# Patient Record
Sex: Male | Born: 1975 | Race: Black or African American | Hispanic: No | Marital: Single | Smoking: Never smoker
Health system: Southern US, Community
[De-identification: ages and names within clinical notes are randomized; demographics above are authoritative.]

## PROBLEM LIST (undated history)

## (undated) HISTORY — PX: LEG SURGERY: SHX1003

---

## 1999-02-15 ENCOUNTER — Emergency Department (HOSPITAL_COMMUNITY): Admission: EM | Admit: 1999-02-15 | Discharge: 1999-02-15 | Payer: Self-pay | Admitting: Emergency Medicine

## 2001-03-30 ENCOUNTER — Emergency Department (HOSPITAL_COMMUNITY): Admission: EM | Admit: 2001-03-30 | Discharge: 2001-03-31 | Payer: Self-pay | Admitting: Emergency Medicine

## 2001-03-31 ENCOUNTER — Emergency Department (HOSPITAL_COMMUNITY): Admission: EM | Admit: 2001-03-31 | Discharge: 2001-04-01 | Payer: Self-pay | Admitting: Emergency Medicine

## 2001-10-13 ENCOUNTER — Emergency Department (HOSPITAL_COMMUNITY): Admission: EM | Admit: 2001-10-13 | Discharge: 2001-10-13 | Payer: Self-pay

## 2001-12-30 ENCOUNTER — Emergency Department (HOSPITAL_COMMUNITY): Admission: EM | Admit: 2001-12-30 | Discharge: 2001-12-31 | Payer: Self-pay | Admitting: Emergency Medicine

## 2002-02-01 ENCOUNTER — Emergency Department (HOSPITAL_COMMUNITY): Admission: EM | Admit: 2002-02-01 | Discharge: 2002-02-01 | Payer: Self-pay | Admitting: Emergency Medicine

## 2002-04-10 ENCOUNTER — Emergency Department (HOSPITAL_COMMUNITY): Admission: EM | Admit: 2002-04-10 | Discharge: 2002-04-10 | Payer: Self-pay | Admitting: Emergency Medicine

## 2002-07-18 ENCOUNTER — Emergency Department (HOSPITAL_COMMUNITY): Admission: EM | Admit: 2002-07-18 | Discharge: 2002-07-18 | Payer: Self-pay | Admitting: Emergency Medicine

## 2002-08-03 ENCOUNTER — Emergency Department (HOSPITAL_COMMUNITY): Admission: EM | Admit: 2002-08-03 | Discharge: 2002-08-03 | Payer: Self-pay | Admitting: Emergency Medicine

## 2002-08-13 ENCOUNTER — Emergency Department (HOSPITAL_COMMUNITY): Admission: EM | Admit: 2002-08-13 | Discharge: 2002-08-13 | Payer: Self-pay | Admitting: Emergency Medicine

## 2003-07-06 ENCOUNTER — Emergency Department (HOSPITAL_COMMUNITY): Admission: EM | Admit: 2003-07-06 | Discharge: 2003-07-06 | Payer: Self-pay | Admitting: Emergency Medicine

## 2003-07-12 ENCOUNTER — Emergency Department (HOSPITAL_COMMUNITY): Admission: EM | Admit: 2003-07-12 | Discharge: 2003-07-12 | Payer: Self-pay | Admitting: Emergency Medicine

## 2003-07-26 ENCOUNTER — Emergency Department (HOSPITAL_COMMUNITY): Admission: EM | Admit: 2003-07-26 | Discharge: 2003-07-26 | Payer: Self-pay | Admitting: Emergency Medicine

## 2003-07-28 ENCOUNTER — Emergency Department (HOSPITAL_COMMUNITY): Admission: EM | Admit: 2003-07-28 | Discharge: 2003-07-28 | Payer: Self-pay | Admitting: Emergency Medicine

## 2003-07-29 ENCOUNTER — Emergency Department (HOSPITAL_COMMUNITY): Admission: EM | Admit: 2003-07-29 | Discharge: 2003-07-29 | Payer: Self-pay | Admitting: Emergency Medicine

## 2004-04-02 ENCOUNTER — Emergency Department (HOSPITAL_COMMUNITY): Admission: EM | Admit: 2004-04-02 | Discharge: 2004-04-02 | Payer: Self-pay | Admitting: Emergency Medicine

## 2008-12-29 ENCOUNTER — Emergency Department (HOSPITAL_COMMUNITY): Admission: EM | Admit: 2008-12-29 | Discharge: 2008-12-30 | Payer: Self-pay | Admitting: Emergency Medicine

## 2010-06-13 ENCOUNTER — Inpatient Hospital Stay (HOSPITAL_COMMUNITY)
Admission: AC | Admit: 2010-06-13 | Discharge: 2010-07-01 | Payer: Self-pay | Source: Home / Self Care | Attending: Orthopedic Surgery | Admitting: Orthopedic Surgery

## 2010-07-14 ENCOUNTER — Inpatient Hospital Stay (HOSPITAL_COMMUNITY)
Admission: AD | Admit: 2010-07-14 | Discharge: 2010-07-27 | Payer: Self-pay | Source: Home / Self Care | Attending: Plastic Surgery | Admitting: Plastic Surgery

## 2010-07-22 LAB — COMPREHENSIVE METABOLIC PANEL
ALT: 11 U/L (ref 0–53)
AST: 20 U/L (ref 0–37)
Albumin: 2.9 g/dL — ABNORMAL LOW (ref 3.5–5.2)
Alkaline Phosphatase: 63 U/L (ref 39–117)
BUN: 9 mg/dL (ref 6–23)
CO2: 28 mEq/L (ref 19–32)
Calcium: 9.4 mg/dL (ref 8.4–10.5)
Chloride: 102 mEq/L (ref 96–112)
Creatinine, Ser: 1.44 mg/dL (ref 0.4–1.5)
GFR calc Af Amer: >60 mL/min (ref >60)
GFR calc non Af Amer: 56 mL/min — ABNORMAL LOW (ref >60)
Glucose, Bld: 89 mg/dL (ref 70–99)
Potassium: 4.3 mEq/L (ref 3.5–5.1)
Sodium: 141 mEq/L (ref 135–145)
Total Bilirubin: 0.3 mg/dL (ref 0.3–1.2)
Total Protein: 6.7 g/dL (ref 6.0–8.3)

## 2010-07-22 LAB — CBC
HCT: 28.3 % — ABNORMAL LOW (ref 39.0–52.0)
Hemoglobin: 8.9 g/dL — ABNORMAL LOW (ref 13.0–17.0)
MCH: 27.3 pg (ref 26.0–34.0)
MCHC: 31.4 g/dL (ref 30.0–36.0)
MCV: 86.8 fL (ref 78.0–100.0)
Platelets: 564 10*3/uL — ABNORMAL HIGH (ref 150–400)
RBC: 3.26 MIL/uL — ABNORMAL LOW (ref 4.22–5.81)
RDW: 15.6 % — ABNORMAL HIGH (ref 11.5–15.5)
WBC: 6.5 10*3/uL (ref 4.0–10.5)

## 2010-08-13 NOTE — Op Note (Signed)
  [name], [name]              ACCOUNT NO.:  [id number]  MEDICAL RECORD NO.:  [id number]          PATIENT TYPE:  INP  LOCATION:  5012                         FACILITY:  [facility]  PHYSICIAN:  Loreta Ave, MD DATE OF BIRTH:  10-24-1975  DATE OF PROCEDURE:  07/15/2010 DATE OF DISCHARGE:                              OPERATIVE REPORT   PREOPERATIVE DIAGNOSIS:  Left open tibia fracture, Gustilo IIIB.  POSTOPERATIVE DIAGNOSIS:  Left open tibia fracture, Gustilo IIIB.  PROCEDURE:  Debridement of failed free flap, left leg.  SURGEON:  Loreta Ave, MD  ANESTHESIA:  General.  ESTIMATED BLOOD LOSS:  100 mL.  SPECIMENS:  Cultures sent to microbiology.  FINDINGS:  Necrotic flap without purulence.  COMPLICATIONS:  None.  CLINICAL INDICATION:  Don Beard is a 35 year old male who is status post free flap coverage of the left open Gustilo IIIB tibia fracture. He initially did well and his free latissimus flap was healing uneventfully.  He was discharged from the hospital approximately 2 weeks ago without apparent complication.  He had been seen regularly in the outpatient setting with a viable free flap.  However, he came into my office yesterday for routine check with clearly nonviable flap.  He was admitted to the hospital last night and begun on empiric antibiotics.  He understood that today was going to be a debridement procedure and placement of a VAC.  He understood the risks of surgery to include but not be limited to bleeding, infection, damage to nearby structures as well as the need for more surgery.  He desired to proceed.  DESCRIPTION OF OPERATION:  The patient was brought to the operating room, placed in the supine position on the operating room table.  After a smooth and routine induction of general anesthesia, left lower extremity was prepped with chlorhexidine and draped into a sterile field.  His flap was examined and was entirely nonviable.  It  was removed from the left leg with Mayo scissors.  There was obvious thrombosis throughout the flap.  The deep surface of the flap was rather adherent to the underlying tissue suggesting an interval period of healing before flap loss.  The flap was removed in its entirety and the pedicle down to the anterior tibial artery was dissected.  It was clamped and ligated.  Both the artery and the vein were thrombosed. Next, a curette was used to remove all nonviable debris from the wound and the wound was irrigated with 6 liters of normal saline via pulse lavage and then 500 mL of bacitracin-containing normal saline via pulse lavage.  A VAC dressing was then applied and set to 125 mm of continuous suction with good seal.  Sponge and needle counts reported as correct x2.  The patient was then extubated and transferred to the recovery room in stable condition.     Loreta Ave, MD     CF/MEDQ  D:  07/15/2010  T:  07/15/2010  Job:  [id number]  Electronically Signed by Loreta Ave MD on 08/13/2010 03:18:03 PM
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
## 2010-08-13 NOTE — Discharge Summary (Signed)
Don Beard, Don Beard              ACCOUNT NO.:  [id number]  MEDICAL RECORD NO.:  [id number]          PATIENT TYPE:  INP  LOCATION:  5012                         FACILITY:  [facility]  PHYSICIAN:  Loreta Ave, MD DATE OF BIRTH:  [birth year]  DATE OF ADMISSION:  07/14/2010 DATE OF DISCHARGE:  07/27/2010                              DISCHARGE SUMMARY   DISCHARGE DIAGNOSES: 1. Left lower extremity Gustilo IIIB open tibia-fibula fracture. 2. Acute stress disorder.  PROCEDURES: 1. July 15, 2010, debridement of left lower extremity free flap. 2. July 18, 2010, debridement of left lower extremity and VAC     placement. 3. July 22, 2010, left lower extremity split-thickness skin graft     and fasciocutaneous flap with VAC application.  MICROBIOLOGY DATA:  Wound culture, July 15, 2010, of the left lower extremity revealed multiple organisms, no Strep A, and no Staph.  IV LINES:  PICC inserted, July 25, 2010.  LABORATORY DATA:  July 22, 2010, white blood count was 6.5, hemoglobin was 8.9, hematocrit was 28.3, platelets were 564, and albumin was 2.9.  CONSULTATIONS: 1. Infectious Disease consult was obtained on July 25, 2010 for     antibiotic recommendations due to left lower extremity Gustilo IIIB     open tip-fib fracture. 2. Psych consult was obtained on July 25, 2010 for acute stress     disorder related to his traumatic injury to his left lower     extremity.  HISTORY:  Don Beard is a 35 year old African American male admitted to the hospital on July 14, 2010 for a necrotic left leg.  He has a history of traumatic injury to left lower extremity, the result of this is left leg being caught in a conveyor belt while working.  This resulted in a Gustilo IIIB fracture with an open tib-fib that required a free latissimus flap for coverage of the bone.  During his followup appointment, he presented with a necrotic muscle to the left lower extremity on  July 14, 2010.  He was therefore admitted for a debridement of his left lower extremity.  The initial debridement was performed on July 15, 2010 with removal of necrotic free flap.  The second surgical debridement was completed on July 18, 2010 with a placement of VAC on the left lower extremity.  The final procedure was completed on July 22, 2010 with a fasciocutaneous flap to left lower extremity with split-thickness skin graft.  While in the hospital, he remained on Lovenox for DVT prevention.  He was afebrile and his vital signs remained stable.  Don Beard pain was adequately managed while hospitalized.  At the time of discharge, his left lower extremity split- thickness skin graft was 100% take and his flap on the left lower extremity and the place with the edge was approximated.  No exudate, no odor, and no surrounding erythema was noted.  HOSPITAL COURSE BY DISCHARGE DIAGNOSIS: 1. Left lower extremity Gustilo IIIB open tib-fib fracture. 2. Acute stress disorder.  DISCHARGE MEDICATIONS: 1. Docusate 100 mg by mouth twice daily. 2. Invanz IV 1 gram daily. 3. Dilaudid 4 mg by mouth every 2 hours as needed  for pain. 4. Robaxin 1000 mg by mouth 4 times a day. 5. OxyContin 40 mg by mouth twice daily for pain. 6. Oxycodone 20-30 mg by mouth every 4 hours as needed for pain. 7. Lyrica 75 mg by mouth 3 times a day. 8. Rifampin 600 mg by mouth every 24 hours. 9. Senna 1 tablet by mouth twice daily before meals as needed for     constipation. 10.Docusate 100 mg 4-5 tablets by mouth daily.  DISCHARGE INSTRUCTIONS:  He is instructed not to drive while on narcotics.  DIET:  No dietary restrictions.  WOUND CARE:  Left lower extremity, Xeroform, Kerlix, and Ace wrap daily. Right thigh, change Mepilex border every 7 days until healed.  FOLLOWUP APPOINTMENTS: 1. Dr. Noelle Penner to be called at [phone] for a followup appointment on     August 04, 2010. 2. [facility] for a followup appointment to his home. 3. Dr. Carola Frost is to call for a followup up appointment at [phone]. 4. Home Health[facility] will follow him upon discharge for wound     care and IV antibiotics and PICC line maintenance. 5. He is also instructed to call [facility] to make     a followup outpatient appointment.  Don Beard is instructed to     call Dr. Noelle Penner if he experiences any nausea, vomiting, fever,     chills, or pain not relieved with medication.  DISPOSITION:  At the time of discharge, Don Beard has met the maximum benefit from inpatient therapy as medically stable for discharge to home.  TIME SPENT ON DISPOSITION:  30 minutes.     Tonye Becket, NP   ______________________________ Loreta Ave, MD    AC/MEDQ  D:  07/27/2010  T:  07/27/2010  Job:  [id number]  Electronically Signed by Loreta Ave MD on 08/13/2010 03:18:01 PM

## 2010-08-13 NOTE — Discharge Summary (Signed)
Don Beard, Don Beard              ACCOUNT NO.:  [id number]  MEDICAL RECORD NO.:  [id number]          PATIENT TYPE:  INP  LOCATION:  5004                         FACILITY:  [facility]  PHYSICIAN:  Loreta Ave, MD DATE OF BIRTH:  02/21/1976  DATE OF ADMISSION:  06/13/2010 DATE OF DISCHARGE:  07/01/2010                              DISCHARGE SUMMARY   DISCHARGE DIAGNOSES: 1. Grade IIIB open left tibia-fibula fracture status post industrial     accident. 2. Free flap to open wound of left tibia. 3. Acute blood loss anemia, stable. 4. Hyponatremia, resolved. 5. Left medial malleolus fracture.  HOSPITAL COURSE:  On June 22, 2010, Mr. Don Beard was transferred to Dr. Kallie Edward service as he was deemed appropriate for a left free latissimus flap to his left lower extremity wound.  On June 22, 2010, Dr. Noelle Penner performed a left free latissimus flap to his left leg, split- thickness skin graft to his left free flap and debridement of his left lower extremity wound and also application of the VAC.  At the completion of his operative procedure, he remained stable and was transferred to the floor.  He tolerated a regular diet.  He remained afebrile throughout his hospitalization.  His VAC was removed on June 25, 2010, at which time the skin graft was 100% take as well as a positive arterial signal over the muscle flap.  After surgery, an arterial signal was noted throughout his hospitalization of his muscle flap and he also continued to have a Doppler left pedal pulse.  His pain was controlled throughout his hospitalization.  He was voiding spontaneously and his JPs remained in place to his left lateral back incision.  DISCHARGE INSTRUCTIONS:  He is to remain on a regular diet.  ACTIVITIES:  He is to increase his activity slowly and ambulate with crutches and remain nonweightbearing on the left and he is also instructed to elevate his left lower extremity when sitting or in  his bed.  He is not to drive.  WOUND CARE:  Left lower extremity, Xeroform with Kerlix to be changed daily.  His left thigh donor site, he is instructed to change his Mepilex border in 5 days.  His left lateral back is to remain open to air and allow the Steri-Strips to come off.  He is instructed to empty his JP drains daily and record their outputs.  DISCHARGE MEDICATIONS: 1. Aspirin 81 mg by mouth daily. 2. Colace 100 mg by mouth twice daily as needed. 3. Lovenox subcutaneously daily at bedtime. 4. Robaxin 100 mg by mouth every 6 hours as needed for muscle spasms. 5. Oxycodone 30 mg suspended release every 12 hours as needed. 6. Oxycodone 10-30 mg by mouth every 4 hours as needed for pain. 7. Lyrica 75 mg by mouth 3 times a day.  DISCHARGE INSTRUCTIONS:  He is instructed to call Dr. Noelle Penner if he experiences any nausea, vomiting, fever, chills, or pain, not relieved pain, and he is also instructed to call if he has any questions or concerns about his left lower extremity muscle flap.  He is to call and schedule an appointment with Dr. Noelle Penner for  July 05, 2010 at [phone].  He is to follow with Dr. Carola Frost on July 15, 2010.  He will also receive home health which will be provided by [facility]. At the time of discharge, Don Beard has met maximum benefit from inpatient therapy and he is medically stable for discharge to home.     Tonye Becket, NP   ______________________________ Loreta Ave, MD    AC/MEDQ  D:  07/01/2010  T:  07/02/2010  Job:  [id number]  Electronically Signed by Loreta Ave MD on 08/13/2010 03:14:01 PM

## 2010-08-13 NOTE — Op Note (Signed)
Don Beard, Don Beard              ACCOUNT NO.:  [id number]  MEDICAL RECORD NO.:  [id number]          PATIENT TYPE:  INP  LOCATION:  5012                         FACILITY:  [facility]  PHYSICIAN:  Loreta Ave, MD DATE OF BIRTH:  05-Jun-1976  DATE OF PROCEDURE:  07/22/2010 DATE OF DISCHARGE:                              OPERATIVE REPORT   PREOPERATIVE DIAGNOSIS:  Left leg wound.  POSTOPERATIVE DIAGNOSIS:  Left leg wound.  PROCEDURE PERFORMED: 1. Preparation of recipient site for flap and skin graft, left leg. 2. Fasciocutaneous perforator flap, left leg. 3. Split-thickness skin graft, 205 cm2. 4. Application of VAC.  SURGEON:  Loreta Ave, MD  ASSISTANT:  Tonye Becket, NP  ANESTHESIA:  General.  ESTIMATED BLOOD LOSS:  100 mL.  IV FLUIDS:  Per Anesthesia.  URINE OUTPUT:  Not recorded.  COMPLICATIONS:  None.  CLINICAL INDICATION:  Don Beard is a 35 year old male who is status post Gustilo IIIB open tib-fib fracture of the left leg.  He has undergone previous free flap coverage which ultimately failed.  He has been washed out multiple times and now presents for definitive coverage of his left leg.  Surgical options have been discussed with him including prolonged VAC with Integra and eventual skin graft versus fasciocutaneous perforator flap versus repeat attempts at free flap.  He and I have decided to proceed with fasciocutaneous perforator flap coverage as well as skin grafting.  He understands the risks of surgery to include but not be limited to bleeding, infection, damage to nearby structures, nonunion, malunion of his fracture as well as loss of some or all of his flap or skin grafts.  He also understands he may need more surgery down the road.  DESCRIPTION OF OPERATION:  The patient was brought to the operating room and placed in supine position on the operating room table.  After smooth and routine induction of general anesthesia with endotracheal  tube, bilateral lower extremities were prepped with Betadine scrub and paint and draped into sterile fields.  The right lower extremity was to be used for a skin graft harvest site.  Attention was first turned to the identification of fasciocutaneous perforator of the peroneal artery along the lateral intermuscular septum.  One was located approximately 2 cm inferior to his wound.  Given the patient's wound architecture of having exposed fracture site and tibia without periosteum and the distal third of the leg and the inferior aspect of the soft tissue defect, a rotational flap on the lateral and inferior portion of the defect would best cover this.  The perforating artery and vein were identified via Doppler and marked.  The flap was designed to be 6.5 cm wide and 9 cm long.  It was designed to be a rotational-type flap.  Next, his fracture site as well as the exposed wound were debrided with curettes and copiously irrigated.  Hemostasis was verified with bipolar electrocautery.  Next, the flap was incised from its medial aspect moving inferiorly towards the base of the pedicle.  This was done with a 10 blade and large perforating veins were controlled with 4-0 Vicryl ties and bipolar cautery was used  to obtain hemostasis.  I dissected in the superficial fascial plane of the leg.  This was relatively avascular.  Once the flap was raised in its entirety, it was elevated and the pedicle was again listened to with a Doppler and there was audible signal in both the artery and vein at the base of the flap.  A back cut was then performed for 2.5 cm to allow rotation to cover the fracture site and rotated it well.  The distal 1.5 cm were trimmed to fit the skin edge on the medial side of the defect and this was inset without tension.  Next, the wound was irrigated with normal saline. Hemostasis was verified with bipolar electrocautery.  Next, a Zimmer Dermatome was used to harvest skin grafts  from the right thigh.  Two- inch guard was used and three 10-cm sections were harvested at 04/999 of an inch thick and meshed in a 1.5:1 fashion.  Next, the skin grafts were spread over the flap donor site as well as the soft tissue defect of the leg.  The flap donor site was 6 x 8 cm.  The soft tissue defect was 16 x 10 cm.  The skin graft was then inset dermis side down and trimmed to fit the defects.  Skin grafts were then sewn with running 4-0 chromic sutures.  The flap was inset along the medial margin of the wound with interrupted 2-0 nylon sutures.  Mepilex Border was placed on the right thigh donor site  Map with holes was placed over the skin graft as was a VAC dressing which was set to 125 mm of continuous suction with good seal.  Kerlix wrap as well as an Ace wrap and a heel ulcer prevention boot were placed on the left leg.  The patient tolerated the procedure well, was extubated, and transferred to recovery room in stable condition.  Sponge and needle counts were reported as correct x2.     Loreta Ave, MD     CF/MEDQ  D:  07/22/2010  T:  07/23/2010  Job:  [id number]  Electronically Signed by Loreta Ave MD on 08/13/2010 03:18:06 PM

## 2010-08-13 NOTE — Op Note (Signed)
  [name], [name]              ACCOUNT NO.:  [id number]  MEDICAL RECORD NO.:  [id number]          PATIENT TYPE:  INP  LOCATION:  5012                         FACILITY:  [facility]  PHYSICIAN:  Loreta Ave, MD DATE OF BIRTH:  [birth year]  DATE OF PROCEDURE:  07/18/2010 DATE OF DISCHARGE:                              OPERATIVE REPORT   PREOPERATIVE DIAGNOSIS:  Left leg wound.  POSTOPERATIVE DIAGNOSIS:  Left leg wound.  SURGEON:  Loreta Ave, MD  PROCEDURE PERFORMED:  Irrigation and debridement of left leg wound, application of MAC.  ESTIMATED BLOOD LOSS:  Minimal.  IV FLUIDS:  Per Anesthesia.  URINE OUTPUT:  Not recorded.  COMPLICATIONS:  None.  CLINICAL INDICATION:  Don Beard is a 35 year old male who sustained Gustilo IIIB open tib-fib fracture approximately 3 weeks ago.  He had a free flap placed which failed after 2 weeks.  He was returned to the operating room last week for debridement and presents again for serial debridements and washout of his fracture site.  He understands the risks of surgery to include but not be limited to bleeding, infection, damage to nearby structures, hardware infection, nonunion, malunion, the need for more surgery.  He desires to proceed.  DESCRIPTION OF OPERATION:  The patient was brought to the operating room, placed in the supine position on the operating room table.  He was continued on his Zosyn antibiotic therapy.  His back was removed, and his left lower extremity was prepped with chlorhexidine and draped into a sterile field.  The wound was debrided gently with curettes and the fracture site was as well.  It was then irrigated with 2 L of normal saline via pulse lavage and then 500 mL of bacitracin containing normal saline via pulse lavage.  Bovie electrocautery was used to obtain hemostasis and a VAC dressing was applied and set to 125 mm of suction with good seal.  Sponge and needle counts were reported as  correct x2. The patient tolerated the procedure well, was extubated and transferred to the recovery room in stable condition.     Loreta Ave, MD     CF/MEDQ  D:  07/22/2010  T:  07/23/2010  Job:  [id number]  Electronically Signed by Loreta Ave MD on 08/13/2010 03:18:12 PM

## 2010-08-29 ENCOUNTER — Ambulatory Visit (INDEPENDENT_AMBULATORY_CARE_PROVIDER_SITE_OTHER): Payer: Worker's Compensation | Admitting: Internal Medicine

## 2010-08-29 ENCOUNTER — Encounter (INDEPENDENT_AMBULATORY_CARE_PROVIDER_SITE_OTHER): Payer: Self-pay | Admitting: *Deleted

## 2010-08-29 ENCOUNTER — Encounter: Payer: Self-pay | Admitting: Internal Medicine

## 2010-08-29 DIAGNOSIS — M86169 Other acute osteomyelitis, unspecified tibia and fibula: Secondary | ICD-10-CM

## 2010-08-29 DIAGNOSIS — F438 Other reactions to severe stress: Secondary | ICD-10-CM | POA: Insufficient documentation

## 2010-08-29 DIAGNOSIS — S82409B Unspecified fracture of shaft of unspecified fibula, initial encounter for open fracture type I or II: Secondary | ICD-10-CM

## 2010-08-29 DIAGNOSIS — F4389 Other reactions to severe stress: Secondary | ICD-10-CM | POA: Insufficient documentation

## 2010-08-29 DIAGNOSIS — D649 Anemia, unspecified: Secondary | ICD-10-CM | POA: Insufficient documentation

## 2010-08-29 DIAGNOSIS — S82209B Unspecified fracture of shaft of unspecified tibia, initial encounter for open fracture type I or II: Secondary | ICD-10-CM | POA: Insufficient documentation

## 2010-08-30 ENCOUNTER — Encounter: Payer: Self-pay | Admitting: Internal Medicine

## 2010-09-05 ENCOUNTER — Telehealth (INDEPENDENT_AMBULATORY_CARE_PROVIDER_SITE_OTHER): Payer: Self-pay | Admitting: *Deleted

## 2010-09-07 NOTE — Assessment & Plan Note (Signed)
Summary: HFU/VS/PER DENISE   Vital Signs:  Patient profile:   35 year old male Height:      75 inches (190.50 cm) Weight:      210 pounds (95.45 kg) BMI:     26.34 Temp:     98.6 degrees F (37.00 degrees C) oral Pulse rate:   79 / minute BP sitting:   136 / 83  (right arm) Cuff size:   large  Vitals Entered By: Jennet Maduro RN (August 29, 2010 10:35 AM) CC: HSFU Is Patient Diabetic? No Pain Assessment Patient in pain? no      Nutritional Status BMI of 25 - 29 = overweight Nutritional Status Detail appetite "good"  Have you ever been in a relationship where you felt threatened, hurt or afraid?not asked today   Does patient need assistance? Functional Status Cook/clean, Shopping, Social activities Ambulation Impaired:Risk for fall Comments uses crutches   CC:  [facility].  History of Present Illness: Mr. Jacarie Pate he is ECG rule I saw when he was hospitalized in late December.  On November 29 he was standing next to a large conveyor belt at work when his leg became entangled in the belt nearly amputating his left leg.  He sustained an open tibia-fibula fracture with extensive contamination.  The conveyor belt was used to load a product called Livlite, a light weight clay material used in [employer]. When hospitalized he underwent debridement and external fixation.  On December 2 he had further debridement and had an intramedullary nail placed across the tibial fracture.  On December 15 he had a latissimus flap and split thickness skin graft placed.  When he was seen back in Dr. Judge Stall office on the summer 29th it was clear that the flap was nonviable so he was readmitted to the hospital and had the flap taken down on the super 30th.  It was clear that the wound was infected and cultures were obtained.  The Gram stain revealed mixed bacteria and cultures grew multiple aerobic and anaerobic organisms.  He is now completing day 45 of antibiotic therapy.  Since discharge  he has been on IV ertapenem  and oral rifampin.  He has not had any problems with his antibiotics or PICC.  He is feeling much better.  Preventive Screening-Counseling & Management  Alcohol-Tobacco     Alcohol drinks/day: 0     Smoking Status: never  Caffeine-Diet-Exercise     Caffeine use/day: yes     Does Patient Exercise: yes     Type of exercise: PT      Exercise (avg: min/session): 60     Times/week: 3  Safety-Violence-Falls     Seat Belt Use: yes  Current Medications (verified): 1)  Invanz 1 Gm Solr (Ertapenem Sodium) .... Administer Via Picc 1 Gram Daily 2)  Rifadin 300 Mg Caps (Rifampin) .... Take 2 Capsules By Mouth Once A Day 3)  Robaxin 500 Mg Tabs (Methocarbamol) .... Take 2 Tablets By Mouth Four Times A Day 4)  Oxycontin 40 Mg Xr12h-Tab (Oxycodone Hcl) .... Take 1 Tablet By Mouth Two Times A Day As Needed For Pain 5)  Oxycodone Hcl 20 Mg Tabs (Oxycodone Hcl) .... Take 1 To 1 1/2  Tablets By Mouth Every 4 Hours As Needed Pain 6)  Lyrica 75 Mg Caps (Pregabalin) .... Take 1 Tablet By Mouth Three Times A Day 7)  Senna 187 Mg Tabs (Senna) .... Take 1 Tablet By Mouth Two Times A Day Before Meals As Needed For Constipation  8)  Docusate Sodium 100 Mg Caps (Docusate Sodium) .... Take 1 Capsule By Mouth Two Times A Day  Allergies: No Known Drug Allergies  Past History:  Past Medical History: Anemia-NOS  Physical Exam  General:  alert and well-nourished.  he is walking with crutches. Extremities:  his right arm PICC site is normal.  His left leg wounds are all healed without any drainage or other signs of inflammation.   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 Jarold Song office recently showed that the tibial fracture site it still not healed.  I will DC the ertapenam and rifampin and have the PICC removed but continued him on antibiotic therapy with  oral Augmentin for at least one more month.  The following medications were removed from the medication list:    Rifadin 300 Mg Caps (Rifampin) .Marland Kitchen... Take 2 capsules by mouth once a day His updated medication list for this problem includes:    Augmentin 875-125 Mg Tabs (Amoxicillin-pot clavulanate) .Marland Kitchen... Take 1 tablet by mouth two times a day    Oxycontin 40 Mg Xr12h-tab (Oxycodone hcl) .Marland Kitchen... Take 1 tablet by mouth two times a day as needed for pain    Oxycodone Hcl 20 Mg Tabs (Oxycodone hcl) .Marland Kitchen... Take 1 to 1 1/2  tablets by mouth every 4 hours as needed pain  Orders: Est. Patient Level III (21308)  Medications Added to Medication List This Visit: 1)  Augmentin 875-125 Mg Tabs (Amoxicillin-pot clavulanate) .... Take 1 tablet by mouth two times a day  Patient Instructions: 1)  Please schedule a follow-up appointment in 1 month. Prescriptions: AUGMENTIN 875-125 MG TABS (AMOXICILLIN-POT CLAVULANATE) Take 1 tablet by mouth two times a day  #60 x 1   Entered and Authorized by:   Cliffton Asters MD   Signed by:   Cliffton Asters MD on 08/29/2010   Method used:   Print then Give to Patient   RxID:   [id number]    Orders Added: 1)  Est. Patient Level III [24401]

## 2010-09-07 NOTE — Miscellaneous (Signed)
Summary: Problems, Medications and Allergies updated  Clinical Lists Changes  Problems: Added new problem of OPEN FRACTURE UNSPECIFIED PART FIBULA W/TIBIA (ICD-823.92) Added new problem of OTHER ACUTE REACTIONS TO STRESS (ICD-308.3) Medications: Added new medication of INVANZ 1 GM SOLR (ERTAPENEM SODIUM) Administer via PICC 1 gram daily Added new medication of ROBAXIN 500 MG TABS (METHOCARBAMOL) Take 2 tablets by mouth four times a day Added new medication of OXYCONTIN 40 MG XR12H-TAB (OXYCODONE HCL) Take 1 tablet by mouth two times a day as needed for pain Added new medication of OXYCODONE HCL 20 MG TABS (OXYCODONE HCL) Take 1 to 1 1/2  tablets by mouth every 4 hours as needed pain Added new medication of LYRICA 75 MG CAPS (PREGABALIN) Take 1 tablet by mouth three times a day Added new medication of RIFADIN 300 MG CAPS (RIFAMPIN) Take 2 capsules by mouth once a day Added new medication of SENNA 187 MG TABS (SENNA) Take 1 tablet by mouth two times a day before meals as needed for constipation Added new medication of DOCUSATE SODIUM 100 MG CAPS (DOCUSATE SODIUM) Take 1 capsule by mouth two times a day Observations: Added new observation of NKA: T (08/29/2010 8:49)

## 2010-09-07 NOTE — Miscellaneous (Signed)
Summary: prior auth  Clinical Lists Changes spoke with rep at [facility] [phone]. she took info to get augmentin approved & states they will contact us in 24-48 hours.Golden Circle RN  August 29, 2010 4:19 PM   Appended Document: prior Berkley Harvey spoke with his mom. states the med was approved & he has it

## 2010-09-13 NOTE — Progress Notes (Signed)
Summary: Environmental health practitioner Note Other Incoming   Caller: Devona Konig, RN, [employer]. Ins.  ph. [phone], fax [phone] Reason for Call: Get patient information Summary of Call: Requesting that pt's next OV be changed.  Ms. Yetta Barre was told that the MD only sees pts. on Tuesday AMs.   Ms. Yetta Barre schedule is full on Tuesday AMs.  Ms. Yetta Barre requested that OV notes be faxed to the [facility]. Ins office.  Jennet Maduro RN  September 05, 2010 9:10 AM

## 2010-09-24 ENCOUNTER — Encounter: Payer: Self-pay | Admitting: *Deleted

## 2010-09-26 LAB — ANAEROBIC CULTURE

## 2010-09-26 LAB — CBC
HCT: 25.9 % — ABNORMAL LOW (ref 39.0–52.0)
Hemoglobin: 8.9 g/dL — ABNORMAL LOW (ref 13.0–17.0)
Hemoglobin: 8.1 g/dL — ABNORMAL LOW (ref 13.0–17.0)
MCH: 28.5 pg (ref 26.0–34.0)
MCHC: 31.3 g/dL (ref 30.0–36.0)
RBC: 3.17 MIL/uL — ABNORMAL LOW (ref 4.22–5.81)
RBC: 2.84 MIL/uL — ABNORMAL LOW (ref 4.22–5.81)
WBC: 7.3 10*3/uL (ref 4.0–10.5)

## 2010-09-26 LAB — WOUND CULTURE

## 2010-09-27 ENCOUNTER — Encounter: Payer: Self-pay | Admitting: Internal Medicine

## 2010-09-27 ENCOUNTER — Ambulatory Visit (INDEPENDENT_AMBULATORY_CARE_PROVIDER_SITE_OTHER): Payer: Worker's Compensation | Admitting: Internal Medicine

## 2010-09-27 DIAGNOSIS — M86169 Other acute osteomyelitis, unspecified tibia and fibula: Secondary | ICD-10-CM

## 2010-09-27 LAB — TYPE AND SCREEN
ABO/RH(D): O POS
Unit division: 0
Unit division: 0
Unit division: 0

## 2010-09-27 LAB — CBC
HCT: 19.6 % — ABNORMAL LOW (ref 39.0–52.0)
HCT: 21.9 % — ABNORMAL LOW (ref 39.0–52.0)
HCT: 22.5 % — ABNORMAL LOW (ref 39.0–52.0)
HCT: 23 % — ABNORMAL LOW (ref 39.0–52.0)
HCT: 23 % — ABNORMAL LOW (ref 39.0–52.0)
HCT: 24.1 % — ABNORMAL LOW (ref 39.0–52.0)
HCT: 27.7 % — ABNORMAL LOW (ref 39.0–52.0)
HCT: 25.4 % — ABNORMAL LOW (ref 39.0–52.0)
HCT: 30.1 % — ABNORMAL LOW (ref 39.0–52.0)
HCT: 43.2 % (ref 39.0–52.0)
Hemoglobin: 7.4 g/dL — ABNORMAL LOW (ref 13.0–17.0)
Hemoglobin: 7.7 g/dL — ABNORMAL LOW (ref 13.0–17.0)
MCH: 29.1 pg (ref 26.0–34.0)
MCH: 29.4 pg (ref 26.0–34.0)
MCH: 29.6 pg (ref 26.0–34.0)
MCH: 29.9 pg (ref 26.0–34.0)
MCH: 29.9 pg (ref 26.0–34.0)
MCH: 29.8 pg (ref 26.0–34.0)
MCHC: 32.2 g/dL (ref 30.0–36.0)
MCHC: 32.4 g/dL (ref 30.0–36.0)
MCHC: 33.7 g/dL (ref 30.0–36.0)
MCHC: 32.6 g/dL (ref 30.0–36.0)
MCHC: 33.1 g/dL (ref 30.0–36.0)
MCHC: 33.2 g/dL (ref 30.0–36.0)
MCV: 88.7 fL (ref 78.0–100.0)
MCV: 89.8 fL (ref 78.0–100.0)
MCV: 89.9 fL (ref 78.0–100.0)
MCV: 90.1 fL (ref 78.0–100.0)
MCV: 90.4 fL (ref 78.0–100.0)
Platelets: 156 10*3/uL (ref 150–400)
Platelets: 157 10*3/uL (ref 150–400)
Platelets: 330 10*3/uL (ref 150–400)
Platelets: 722 10*3/uL — ABNORMAL HIGH (ref 150–400)
Platelets: 266 10*3/uL (ref 150–400)
RBC: 2.54 MIL/uL — ABNORMAL LOW (ref 4.22–5.81)
RBC: 2.6 MIL/uL — ABNORMAL LOW (ref 4.22–5.81)
RBC: 2.68 MIL/uL — ABNORMAL LOW (ref 4.22–5.81)
RDW: 13.5 % (ref 11.5–15.5)
RDW: 13.7 % (ref 11.5–15.5)
RDW: 13.7 % (ref 11.5–15.5)
RDW: 13.9 % (ref 11.5–15.5)
RDW: 14.2 % (ref 11.5–15.5)
RDW: 14.4 % (ref 11.5–15.5)
RDW: 14 % (ref 11.5–15.5)
RDW: 14.4 % (ref 11.5–15.5)
RDW: 14.5 % (ref 11.5–15.5)
WBC: 6.6 10*3/uL (ref 4.0–10.5)
WBC: 7.6 10*3/uL (ref 4.0–10.5)
WBC: 8.7 10*3/uL (ref 4.0–10.5)
WBC: 9.7 10*3/uL (ref 4.0–10.5)
WBC: 10 10*3/uL (ref 4.0–10.5)
WBC: 8.9 10*3/uL (ref 4.0–10.5)

## 2010-09-27 LAB — BASIC METABOLIC PANEL
BUN: 10 mg/dL (ref 6–23)
BUN: 3 mg/dL — ABNORMAL LOW (ref 6–23)
BUN: 4 mg/dL — ABNORMAL LOW (ref 6–23)
BUN: 9 mg/dL (ref 6–23)
BUN: 5 mg/dL — ABNORMAL LOW (ref 6–23)
BUN: 7 mg/dL (ref 6–23)
CO2: 29 mEq/L (ref 19–32)
CO2: 30 mEq/L (ref 19–32)
CO2: 32 mEq/L (ref 19–32)
Chloride: 94 mEq/L — ABNORMAL LOW (ref 96–112)
Chloride: 94 mEq/L — ABNORMAL LOW (ref 96–112)
Chloride: 96 mEq/L (ref 96–112)
Chloride: 98 mEq/L (ref 96–112)
Chloride: 101 mEq/L (ref 96–112)
Creatinine, Ser: 1.24 mg/dL (ref 0.4–1.5)
Creatinine, Ser: 1.24 mg/dL (ref 0.4–1.5)
Creatinine, Ser: 1.31 mg/dL (ref 0.4–1.5)
GFR calc Af Amer: >60 mL/min (ref >60)
GFR calc Af Amer: >60 mL/min (ref >60)
GFR calc non Af Amer: 59 mL/min — ABNORMAL LOW (ref >60)
GFR calc non Af Amer: >60 mL/min (ref >60)
GFR calc non Af Amer: >60 mL/min (ref >60)
GFR calc non Af Amer: 59 mL/min — ABNORMAL LOW (ref >60)
Glucose, Bld: 107 mg/dL — ABNORMAL HIGH (ref 70–99)
Glucose, Bld: 136 mg/dL — ABNORMAL HIGH (ref 70–99)
Glucose, Bld: 140 mg/dL — ABNORMAL HIGH (ref 70–99)
Glucose, Bld: 111 mg/dL — ABNORMAL HIGH (ref 70–99)
Glucose, Bld: 125 mg/dL — ABNORMAL HIGH (ref 70–99)
Potassium: 4.1 mEq/L (ref 3.5–5.1)
Potassium: 4.1 mEq/L (ref 3.5–5.1)
Potassium: 4.2 mEq/L (ref 3.5–5.1)
Potassium: 4.4 mEq/L (ref 3.5–5.1)
Potassium: 4.6 mEq/L (ref 3.5–5.1)
Potassium: 4.8 mEq/L (ref 3.5–5.1)
Potassium: 4 mEq/L (ref 3.5–5.1)
Potassium: 4.2 mEq/L (ref 3.5–5.1)
Sodium: 129 mEq/L — ABNORMAL LOW (ref 135–145)
Sodium: 131 mEq/L — ABNORMAL LOW (ref 135–145)
Sodium: 133 mEq/L — ABNORMAL LOW (ref 135–145)
Sodium: 135 mEq/L (ref 135–145)

## 2010-09-27 LAB — COMPREHENSIVE METABOLIC PANEL
ALT: 17 U/L (ref 0–53)
Albumin: 4 g/dL (ref 3.5–5.2)
BUN: 17 mg/dL (ref 6–23)
Calcium: 9.4 mg/dL (ref 8.4–10.5)
Glucose, Bld: 129 mg/dL — ABNORMAL HIGH (ref 70–99)
Potassium: 3.9 meq/L (ref 3.5–5.1)
Sodium: 141 meq/L (ref 135–145)
Total Protein: 7 g/dL (ref 6.0–8.3)

## 2010-09-27 LAB — CROSSMATCH: Unit division: 0

## 2010-09-27 LAB — URINALYSIS, MICROSCOPIC ONLY
Nitrite: NEGATIVE
Protein, ur: NEGATIVE mg/dL
Urobilinogen, UA: 0.2 mg/dL (ref 0.0–1.0)

## 2010-09-27 LAB — URINALYSIS, ROUTINE W REFLEX MICROSCOPIC
Glucose, UA: NEGATIVE mg/dL
Hgb urine dipstick: NEGATIVE
Ketones, ur: NEGATIVE mg/dL
Protein, ur: NEGATIVE mg/dL
Urobilinogen, UA: 0.2 mg/dL (ref 0.0–1.0)

## 2010-09-27 LAB — POCT I-STAT, CHEM 8
BUN: 20 mg/dL (ref 6–23)
Hemoglobin: 16 g/dL (ref 13.0–17.0)
Sodium: 142 meq/L (ref 135–145)
TCO2: 30 mmol/L (ref 0–100)

## 2010-09-27 LAB — ABO/RH: ABO/RH(D): O POS

## 2010-09-27 LAB — GENTAMICIN LEVEL, TROUGH: Gentamicin Trough: 0.6 ug/mL (ref 0.5–2.0)

## 2010-09-27 LAB — PROTIME-INR
INR: 0.98 (ref 0.00–1.49)
Prothrombin Time: 13.2 s (ref 11.6–15.2)

## 2010-10-04 NOTE — Assessment & Plan Note (Signed)
Summary: F/U [MKJ]   Vital Signs:  Patient profile:   35 year old male Height:      75 inches (190.50 cm) Weight:      199 pounds (90.45 kg) BMI:     24.96 Temp:     98.0 degrees F (36.67 degrees C) oral Pulse rate:   80 / minute BP sitting:   136 / 82  (left arm) Cuff size:   large  Vitals Entered By: Jennet Maduro RN (September 27, 2010 11:15 AM) CC: follow-up visit Is Patient Diabetic? No Pain Assessment Patient in pain? no      Nutritional Status BMI of 19 -24 = normal Nutritional Status Detail appetite "good"  Have you ever been in a relationship where you felt threatened, hurt or afraid?No   Does patient need assistance? Functional Status Self care Ambulation Impaired:Risk for fall Comments uses a cane for stability   CC:  follow-up visit.  History of Present Illness: Don Beard is in for his routine follow-up visit.  He saw Dr. Carola Frost several weeks ago and was told that his blood work looked good and that he could stop his Augmentin.  He was also told that the bone and not fully healed yet and he was given a bone stimulator to use.  He is having much less pain and feels better.  His leg will tend to swell a little bit after he's been up walking but he has not noticed any new redness, warmth, pain or drainage to suggest recurrent infection.  Preventive Screening-Counseling & Management  Alcohol-Tobacco     Alcohol drinks/day: 0     Smoking Status: never  Caffeine-Diet-Exercise     Caffeine use/day: yes     Does Patient Exercise: yes     Type of exercise: PT      Exercise (avg: min/session): 60     Times/week: 3  Safety-Violence-Falls     Seat Belt Use: yes  Current Medications (verified): 1)  Tramadol Hcl 50 Mg Tabs (Tramadol Hcl) .... Take 1 Tablet By Mouth Two Times A Day As Needed For Pain  Allergies (verified): No Known Drug Allergies  Physical Exam  Extremities:   His left leg wounds are all healed without any drainage or other signs of  inflammation.   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  He knows to call me between now and his next visit if he has any signs of recurrence. The following medications were removed from the medication list:    Augmentin 875-125 Mg Tabs (Amoxicillin-pot clavulanate) .Marland Kitchen... Take 1 tablet by mouth two times a day    Oxycontin 40 Mg Xr12h-tab (Oxycodone hcl) .Marland Kitchen... Take 1 tablet by mouth two times a day as needed for pain    Oxycodone Hcl 20 Mg Tabs (Oxycodone hcl) .Marland Kitchen... Take 1 to 1 1/2  tablets by mouth every 4 hours as needed pain His updated medication list for this problem includes:    Tramadol Hcl 50 Mg Tabs (Tramadol hcl) .Marland Kitchen... Take 1 tablet by mouth two times a day as needed for pain  Orders: Est. Patient Level III (57846)  Medications Added to Medication List This Visit: 1)  Tramadol Hcl 50 Mg Tabs (Tramadol hcl) .... Take 1 tablet by mouth two times a day as needed for pain  Patient  Instructions: 1)  Please schedule a follow-up appointment in 6 weeks.   Orders Added: 1)  Est. Patient Level III [16109]

## 2010-10-14 ENCOUNTER — Ambulatory Visit (HOSPITAL_COMMUNITY)
Admission: RE | Admit: 2010-10-14 | Discharge: 2010-10-14 | Disposition: A | Discharge status: Home / Self Care | Payer: Worker's Compensation | Source: Ambulatory Visit | Attending: Orthopedic Surgery | Admitting: Orthopedic Surgery

## 2010-10-14 ENCOUNTER — Encounter (HOSPITAL_COMMUNITY)
Admission: RE | Admit: 2010-10-14 | Discharge: 2010-10-14 | Disposition: A | Discharge status: Home / Self Care | Payer: Worker's Compensation | Source: Ambulatory Visit | Attending: Orthopedic Surgery | Admitting: Orthopedic Surgery

## 2010-10-14 ENCOUNTER — Other Ambulatory Visit (HOSPITAL_COMMUNITY): Payer: Self-pay | Admitting: Orthopedic Surgery

## 2010-10-14 DIAGNOSIS — Z01811 Encounter for preprocedural respiratory examination: Secondary | ICD-10-CM

## 2010-10-14 DIAGNOSIS — Z01810 Encounter for preprocedural cardiovascular examination: Secondary | ICD-10-CM | POA: Insufficient documentation

## 2010-10-14 DIAGNOSIS — Z01812 Encounter for preprocedural laboratory examination: Secondary | ICD-10-CM | POA: Insufficient documentation

## 2010-10-14 DIAGNOSIS — Z01818 Encounter for other preprocedural examination: Secondary | ICD-10-CM | POA: Insufficient documentation

## 2010-10-14 LAB — CBC
HCT: 44 % (ref 39.0–52.0)
Hemoglobin: 14.5 g/dL (ref 13.0–17.0)
MCH: 25.4 pg — ABNORMAL LOW (ref 26.0–34.0)
MCHC: 33 g/dL (ref 30.0–36.0)
MCV: 77.2 fL — ABNORMAL LOW (ref 78.0–100.0)

## 2010-10-14 LAB — PROTIME-INR: Prothrombin Time: 13.6 seconds (ref 11.6–15.2)

## 2010-10-14 LAB — ABO/RH: ABO/RH(D): O POS

## 2010-10-14 LAB — URINALYSIS, ROUTINE W REFLEX MICROSCOPIC
Bilirubin Urine: NEGATIVE
Ketones, ur: 15 mg/dL — AB
Leukocytes, UA: NEGATIVE
Nitrite: NEGATIVE
Protein, ur: 100 mg/dL — AB

## 2010-10-14 LAB — SURGICAL PCR SCREEN: MRSA, PCR: NEGATIVE

## 2010-10-14 LAB — COMPREHENSIVE METABOLIC PANEL
BUN: 11 mg/dL (ref 6–23)
CO2: 34 mEq/L — ABNORMAL HIGH (ref 19–32)
Calcium: 10.7 mg/dL — ABNORMAL HIGH (ref 8.4–10.5)
Creatinine, Ser: 1.76 mg/dL — ABNORMAL HIGH (ref 0.4–1.5)
GFR calc Af Amer: 53 mL/min — ABNORMAL LOW (ref >60)
GFR calc non Af Amer: 44 mL/min — ABNORMAL LOW (ref >60)
Glucose, Bld: 77 mg/dL (ref 70–99)
Total Bilirubin: 0.5 mg/dL (ref 0.3–1.2)

## 2010-10-14 LAB — APTT: aPTT: 33 seconds (ref 24–37)

## 2010-10-14 LAB — SEDIMENTATION RATE: Sed Rate: 2 mm/hr (ref 0–16)

## 2010-10-14 LAB — TYPE AND SCREEN: ABO/RH(D): O POS

## 2010-10-15 LAB — URINE CULTURE: Culture: NO GROWTH

## 2010-10-15 LAB — C-REACTIVE PROTEIN: CRP: 0.2 mg/dL — ABNORMAL LOW (ref <0.6)

## 2010-10-17 ENCOUNTER — Ambulatory Visit (HOSPITAL_COMMUNITY)
Admission: RE | Admit: 2010-10-17 | Discharge: 2010-10-17 | Disposition: A | Discharge status: Home / Self Care | Payer: Worker's Compensation | Source: Ambulatory Visit | Attending: Orthopedic Surgery | Admitting: Orthopedic Surgery

## 2010-10-17 DIAGNOSIS — Z538 Procedure and treatment not carried out for other reasons: Secondary | ICD-10-CM | POA: Insufficient documentation

## 2010-10-17 DIAGNOSIS — Z01811 Encounter for preprocedural respiratory examination: Secondary | ICD-10-CM | POA: Insufficient documentation

## 2010-10-21 ENCOUNTER — Ambulatory Visit (HOSPITAL_COMMUNITY): Payer: Worker's Compensation

## 2010-10-21 ENCOUNTER — Ambulatory Visit (HOSPITAL_COMMUNITY)
Admission: RE | Admit: 2010-10-21 | Discharge: 2010-10-23 | Disposition: A | Discharge status: Home / Self Care | Payer: Worker's Compensation | Source: Ambulatory Visit | Attending: Orthopedic Surgery | Admitting: Orthopedic Surgery

## 2010-10-21 DIAGNOSIS — M869 Osteomyelitis, unspecified: Secondary | ICD-10-CM | POA: Insufficient documentation

## 2010-10-21 DIAGNOSIS — Z472 Encounter for removal of internal fixation device: Secondary | ICD-10-CM | POA: Insufficient documentation

## 2010-10-21 DIAGNOSIS — IMO0002 Reserved for concepts with insufficient information to code with codable children: Secondary | ICD-10-CM | POA: Insufficient documentation

## 2010-10-21 LAB — GRAM STAIN

## 2010-10-21 LAB — COMPREHENSIVE METABOLIC PANEL
ALT: 43 U/L (ref 0–53)
AST: 36 U/L (ref 0–37)
Alkaline Phosphatase: 81 U/L (ref 39–117)
Glucose, Bld: 88 mg/dL (ref 70–99)
Potassium: 3.9 mEq/L (ref 3.5–5.1)
Sodium: 138 mEq/L (ref 135–145)
Total Protein: 8 g/dL (ref 6.0–8.3)

## 2010-10-23 NOTE — Op Note (Signed)
NAMEYANIEL, LIMBAUGH              ACCOUNT NO.:  [id number]  MEDICAL RECORD NO.:  [id number]           PATIENT TYPE:  I  LOCATION:  5040                         FACILITY:  [facility]  PHYSICIAN:  Doralee Albino. Carola Frost, M.D. DATE OF BIRTH:  [birth year]  DATE OF PROCEDURE:  10/21/2010 DATE OF DISCHARGE:                              OPERATIVE REPORT   PREOPERATIVE DIAGNOSES: 1. Left tibia osteomyelitis. 2. Retained nail. 3. Tibial nonunion.  POSTOPERATIVE DIAGNOSES: 1. Left tibia osteomyelitis. 2. Retained nail. 3. Tibial nonunion.  PROCEDURES: 1. Partial excision of left tibia. 2. Placement of antibiotic nail. 3. Removal of deep implant. 4. Open treatment tibial nonunion.  SURGEON:  Doralee Albino. Carola Frost, MD.  ASSISTANT:  Mearl Latin, PA.  ANESTHESIA:  General.  COMPLICATIONS:  None.  TOURNIQUET:  None.  SPECIMENS:  Three reaming and fibrous tissue sent from the fracture site to micro.  ESTIMATED BLOOD LOSS:  200 mL.  COMPLICATIONS:  None.  DISPOSITION:  To PACU.  CONDITION:  Stable.  BRIEF SUMMARY AND INDICATION OF PROCEDURE:  Don Beard is a 35 year old male, status post open IIIB tibia, treated with staged IM nailing, and then attempted free flap, placement complicated by flap necrosis, serial I and Ds, and subsequent fasciocutaneous flap.  The patient has been followed by the ID Service for infection from an initial flap necrosis with IV treatment, now conversion to p.o. for enterococcus and Proteus species.  X-rays demonstrated some increase in lucency at the fracture site despite normalization in sed rate, CRP, and persistent nonunion.  I discussed to him the risks and benefits of surgery including the possibility of infection, possibility of failure to resolve the infection, new infection, DVT, PE, failure to achieve union, need for further surgery including revision flap and multiple others. The patient wished to proceed.  BRIEF DESCRIPTION OF PROCEDURE:   Mr. Camino was administered preop antibiotics.  As we had already had species, was taken to the operating room where general anesthesia was induced.  His left lower extremity was prepared and draped in usual sterile fashion.  No tourniquet was used during the procedure.  The old incisions were remade at the knee and distal tibia.  The locking bolts identified and removed, save one to prevent rotation of the nail during engagement of the extraction bolt proximally.  Then, the nail was withdrawn in its entirety.  Guidewire was placed.  Sequential reaming performed up to 10.5 as a 9-mm nail had been placed.  The nonunion site was then approached through the fasciocutaneous flap with an incision along the distal edge of the flap medially and reflecting it off its pedicle laterally.  I had spoken with Dr. Noelle Penner regarding the appropriate approach preoperatively.  The flap itself appeared to be very well vascularized at a microvascular level, but the amount of blood supply extending deep to the muscle was not clearly discernible.  The nonunion site itself had some heterotopic bone overlying it anteromedially, but there was clearly some necrosis of the bone at the distal edge where it appeared to be more desiccated.  This was burred back over half a centimeter in the area  of greatest effect, but I did not get significant bleeding even at that level, but it did appear to be healthy bone and certainly had healthy bone lateral to this.  Using a curette, the nonunion site was aggressively debrided all along the edge to remove the fibrous debris.  The canal was irrigated thoroughly after the intramedullary excision through reaming and some of these reaming were sent for microanalysis.  There was no evidence of bacteria on the Gram stain.  I did also obtain an intraoperative consultation from Dr. Wayland Denis from Plastics regarding the advisability of free flap placement given the appearance.  This  is also to familiarize her visit in case she could work together with Dr. Thompson Caul regarding eventual free flap placement.  She did feel like the fascia cutaneous flap was in sufficient condition to continue with it and certainly the patient had healed well from a soft tissue perspective previously.  An antibiotic nail was then fashioned from vancomycin, gentamicin, impregnated Palacos cement through a chest tube with a guidewire.  This was passed through the intramedullary canal and then additional cement and beads placed underneath the fasciocutaneous flap.  All wounds were irrigated thoroughly.  A new drape has been applied following the partial excision prior to placement of the beads in nail.  The patient was taken to the PACU in stable condition.  PROGNOSIS:  Mr. Eleazer will be nonweightbearing as he does have a rotational instability at the fracture site and will continue with IV antibiotics while in the hospital and possible continuation of oral antibiotics following discharge given pending his cultures.  He will have broad-spectrum until that time.  He will return to the OR in 6 weeks for exchange nailing.  He remains at high risk for persistent nonunion and recurrent infection.     Doralee Albino. Carola Frost, M.D.     MHH/MEDQ  D:  10/21/2010  T:  10/22/2010  Job:  [id number]  Electronically Signed by Myrene Galas M.D. on 10/23/2010 03:12:42 PM

## 2010-10-24 LAB — DIFFERENTIAL
Eosinophils Absolute: 0 10*3/uL (ref 0.0–0.7)
Eosinophils Relative: 1 % (ref 0–5)
Lymphocytes Relative: 48 % — ABNORMAL HIGH (ref 12–46)
Lymphs Abs: 2.4 10*3/uL (ref 0.7–4.0)
Monocytes Absolute: 0.5 10*3/uL (ref 0.1–1.0)

## 2010-10-24 LAB — TISSUE CULTURE
Culture: NO GROWTH
Culture: NO GROWTH

## 2010-10-24 LAB — URINALYSIS, ROUTINE W REFLEX MICROSCOPIC
Nitrite: NEGATIVE
Protein, ur: NEGATIVE mg/dL
Specific Gravity, Urine: 1.026 (ref 1.005–1.030)
Urobilinogen, UA: 0.2 mg/dL (ref 0.0–1.0)

## 2010-10-24 LAB — COMPREHENSIVE METABOLIC PANEL
ALT: 14 U/L (ref 0–53)
AST: 28 U/L (ref 0–37)
Albumin: 4.2 g/dL (ref 3.5–5.2)
CO2: 28 mEq/L (ref 19–32)
Calcium: 9.4 mg/dL (ref 8.4–10.5)
Chloride: 106 mEq/L (ref 96–112)
Creatinine, Ser: 1.29 mg/dL (ref 0.4–1.5)
GFR calc Af Amer: >60 mL/min (ref >60)
GFR calc non Af Amer: >60 mL/min (ref >60)
Sodium: 140 mEq/L (ref 135–145)

## 2010-10-24 LAB — CBC
MCHC: 34.3 g/dL (ref 30.0–36.0)
Platelets: 223 10*3/uL (ref 150–400)
RBC: 4.67 MIL/uL (ref 4.22–5.81)
WBC: 5.1 10*3/uL (ref 4.0–10.5)

## 2010-10-26 LAB — ANAEROBIC CULTURE

## 2010-11-08 ENCOUNTER — Ambulatory Visit: Payer: Worker's Compensation | Admitting: Internal Medicine

## 2012-08-08 IMAGING — CR DG PORTABLE PELVIS
1 series · 1 of 1 positions shown · non-contrast
Comparison: None

CLINICAL DATA: Trauma to left leg.  Near amputation.

PORTABLE PELVIS

[view not recorded]
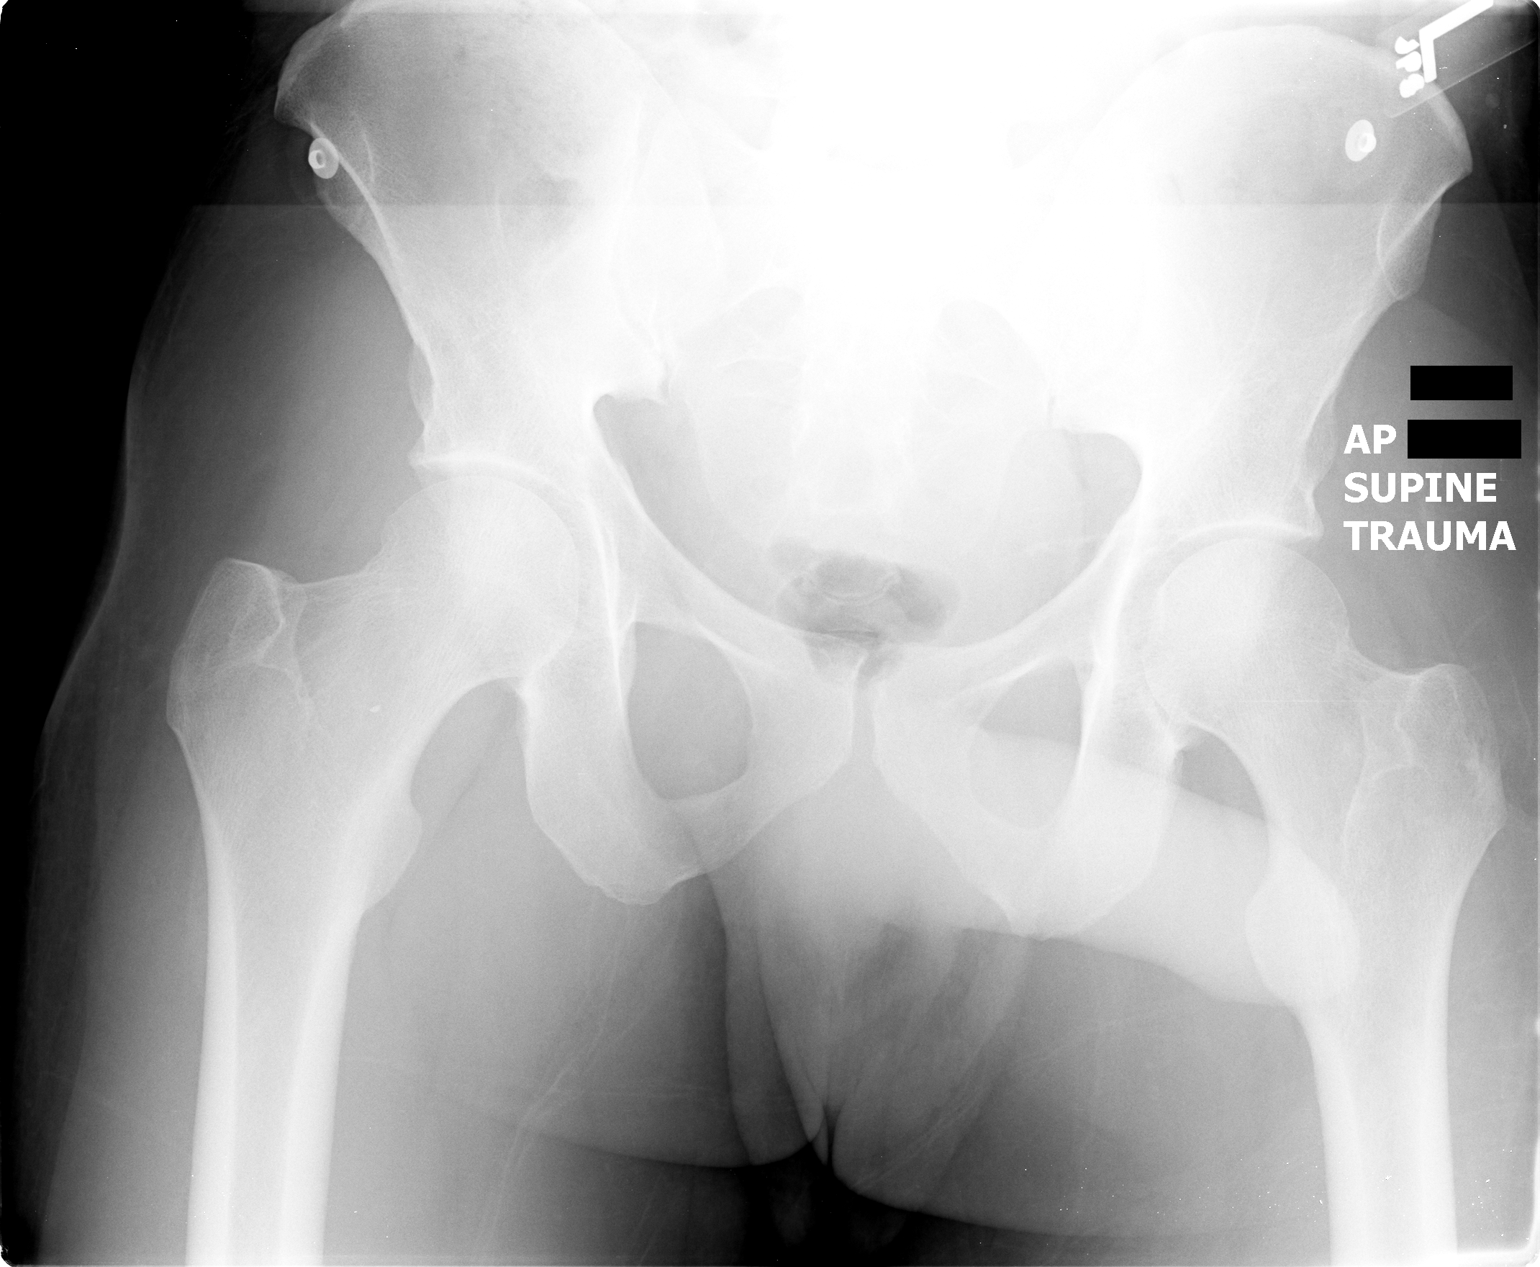

[1 of 1 positions shown; findings below may reference images not displayed]

FINDINGS: No acute bony abnormality.  Specifically, no fracture,
subluxation, or dislocation.  Soft tissues are intact.
IMPRESSION: Negative.

## 2012-08-11 IMAGING — RF DG TIBIA/FIBULA 2V*L*
1 series · 7 of 7 positions shown · non-contrast
Comparison: 06/13/2010

CLINICAL DATA: Injury

LEFT TIBIA AND FIBULA - 2 VIEW

[Series 1: run · 7 of 7 slices shown]
[im 1/7]
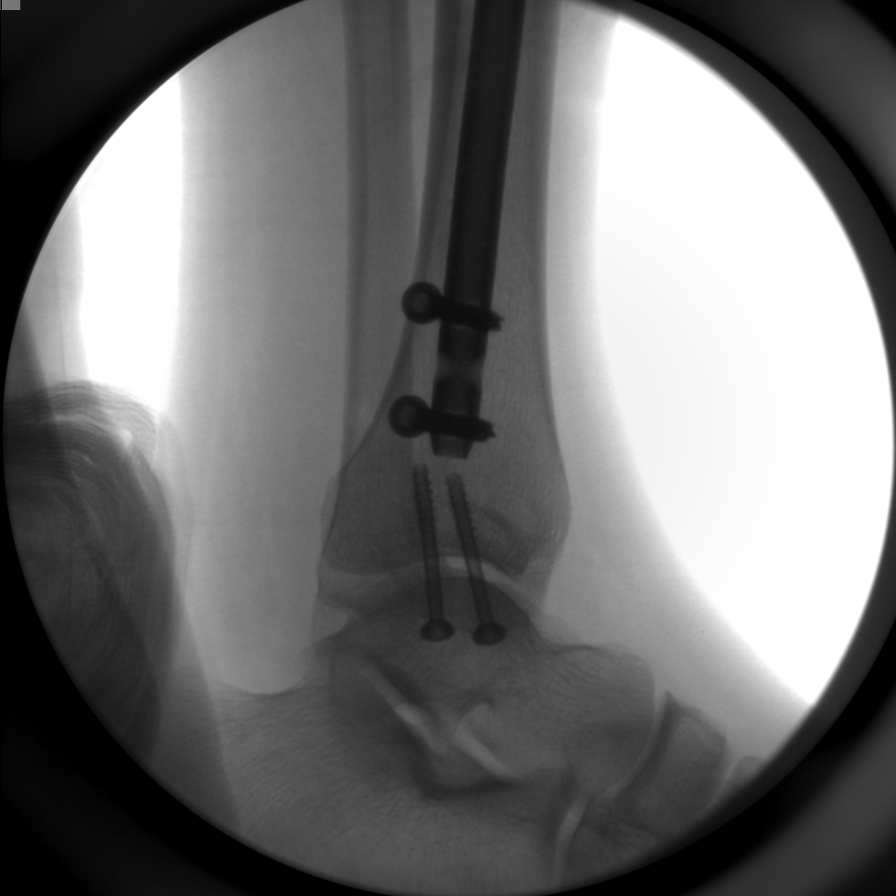
[im 2/7]
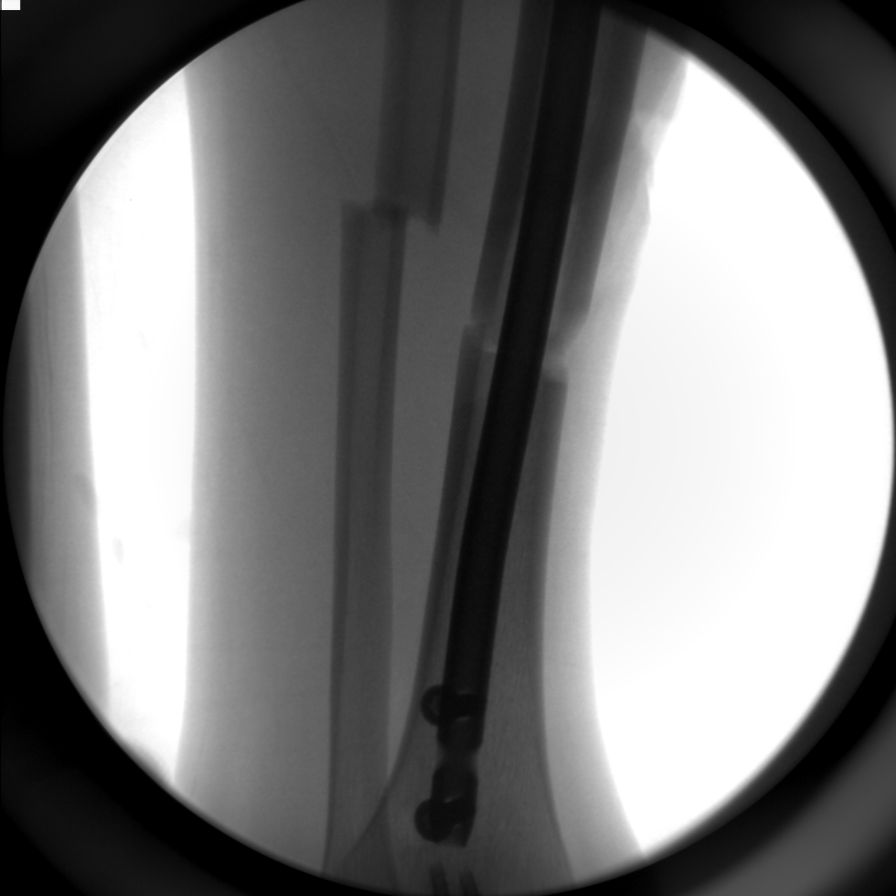
[im 3/7]
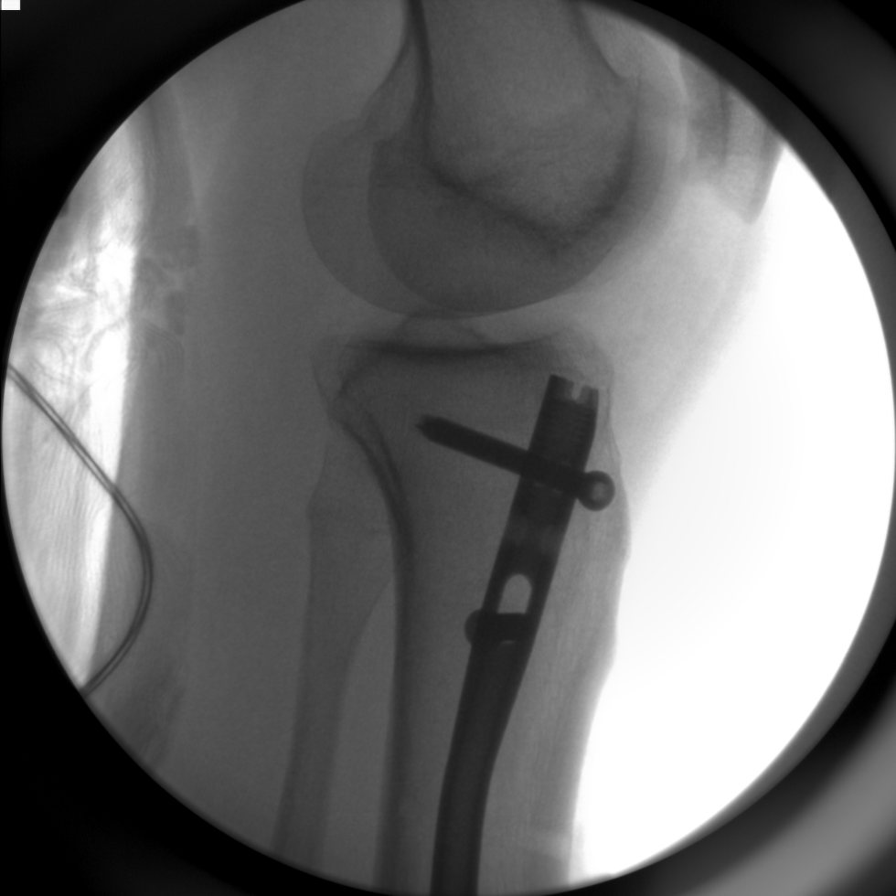
[im 4/7]
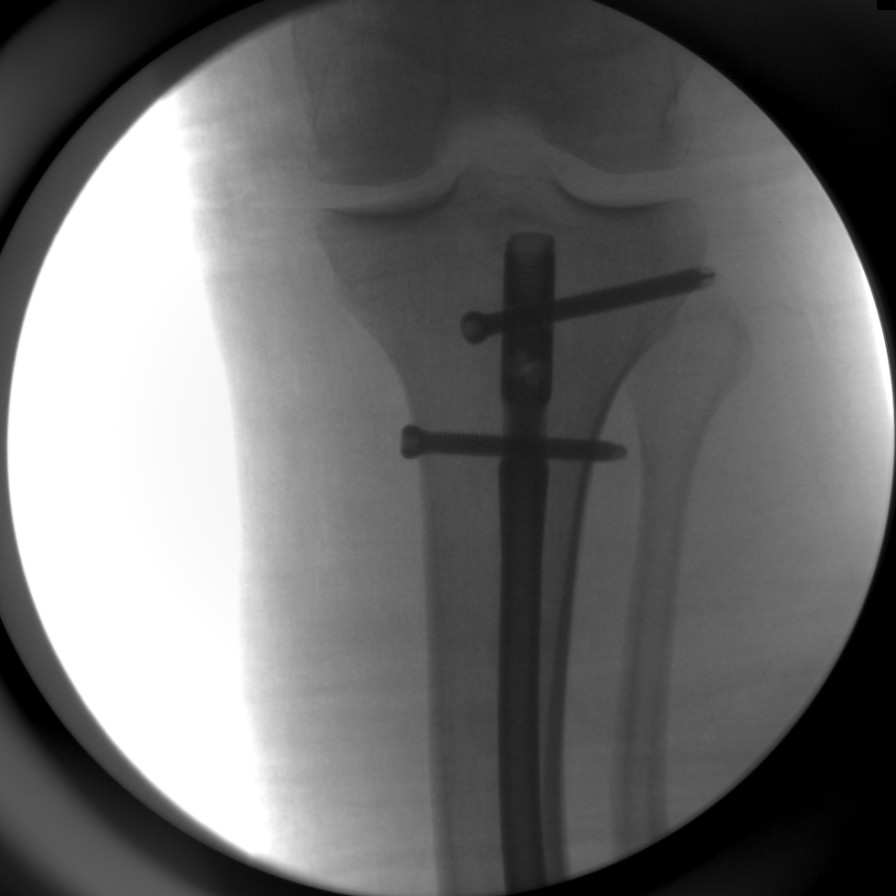
[im 5/7]
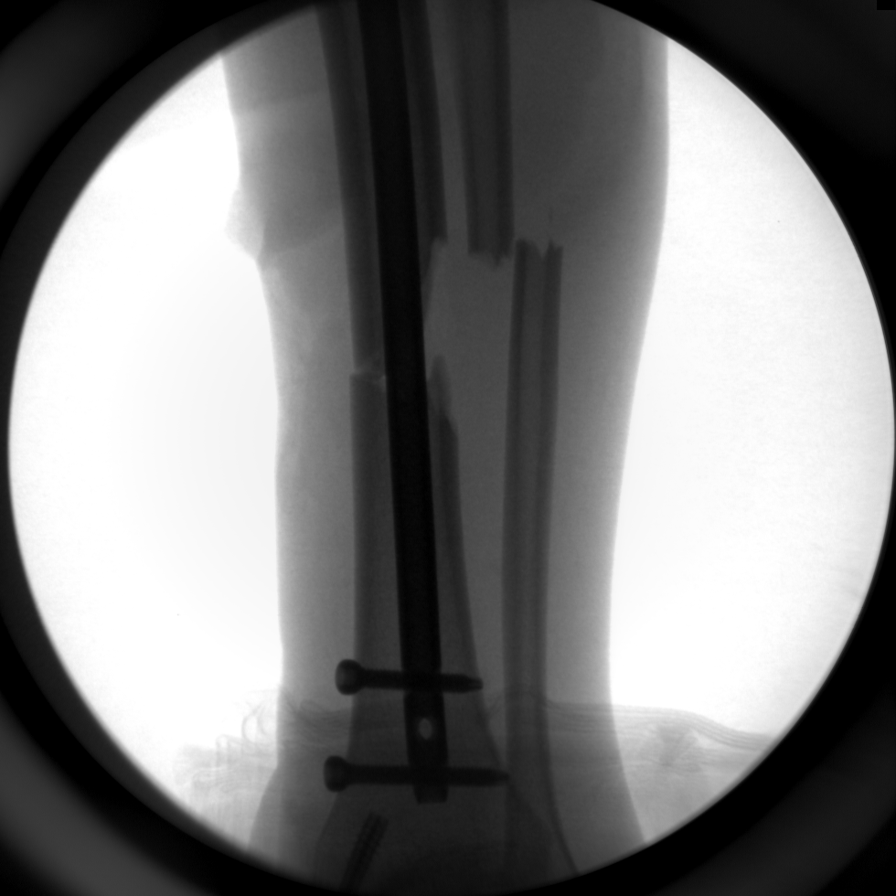
[im 6/7]
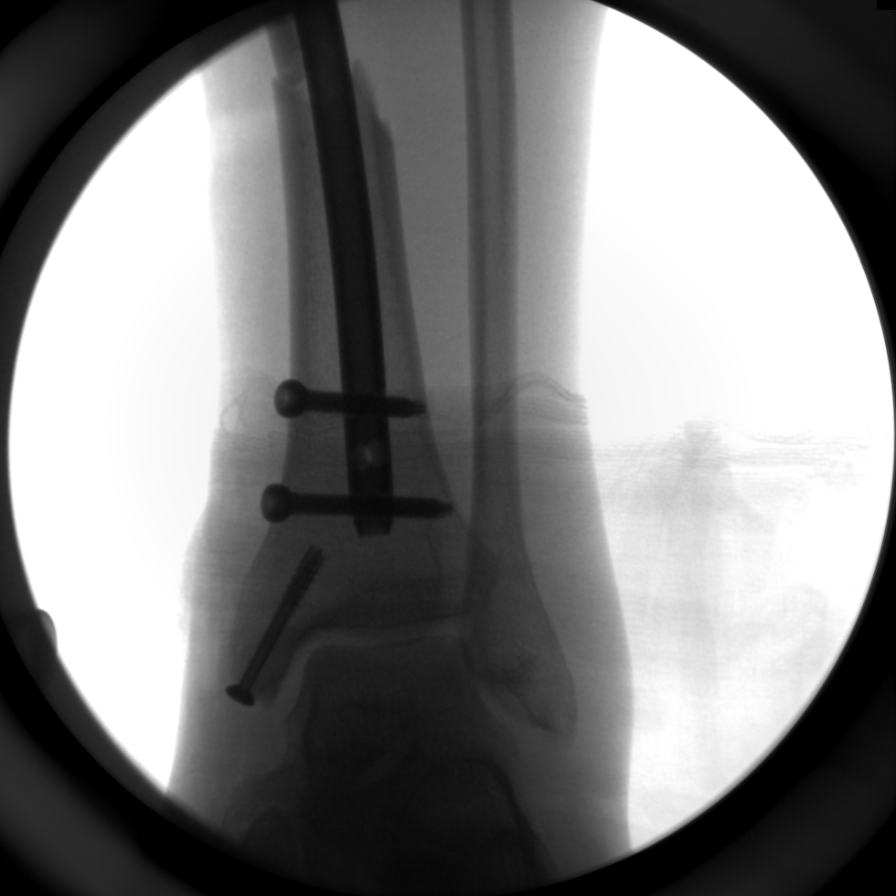
[im 7/7]
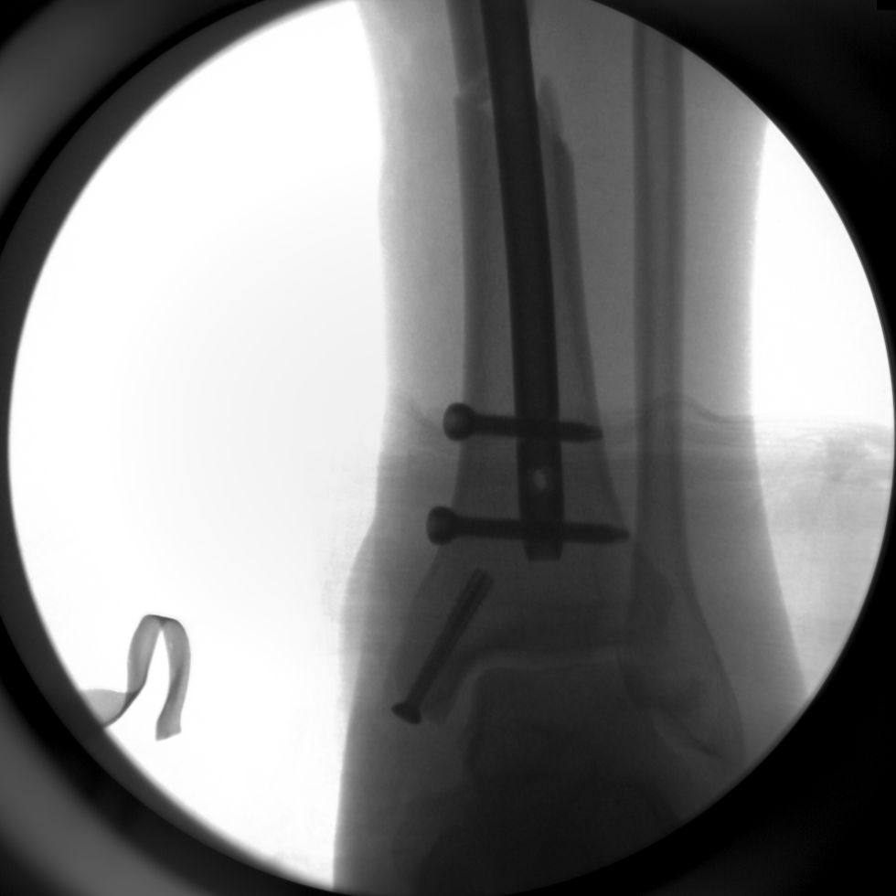

[7 of 7 positions shown; findings below may reference images not displayed]

FINDINGS: Multiple images demonstrate open reduction and internal
fixation of a distal tibia fracture.  Intermedullary rod is in
place.  To proximal and to distal interlocking screws.  Anatomic
alignment.  Fibula fracture remains displaced.  Two screws are seen
transfixing the medial malleolus.
IMPRESSION: ORIF tibial fracture.  ORIF medial malleolus fracture.  Tibial
fracture remains displaced.

## 2012-08-11 IMAGING — CR DG ANKLE PORT 2V*L*
3 series · 3 of 3 positions shown · non-contrast
Comparison: 1144 hours

CLINICAL DATA: Injury

PORTABLE LEFT ANKLE - 2 VIEW

[AP]
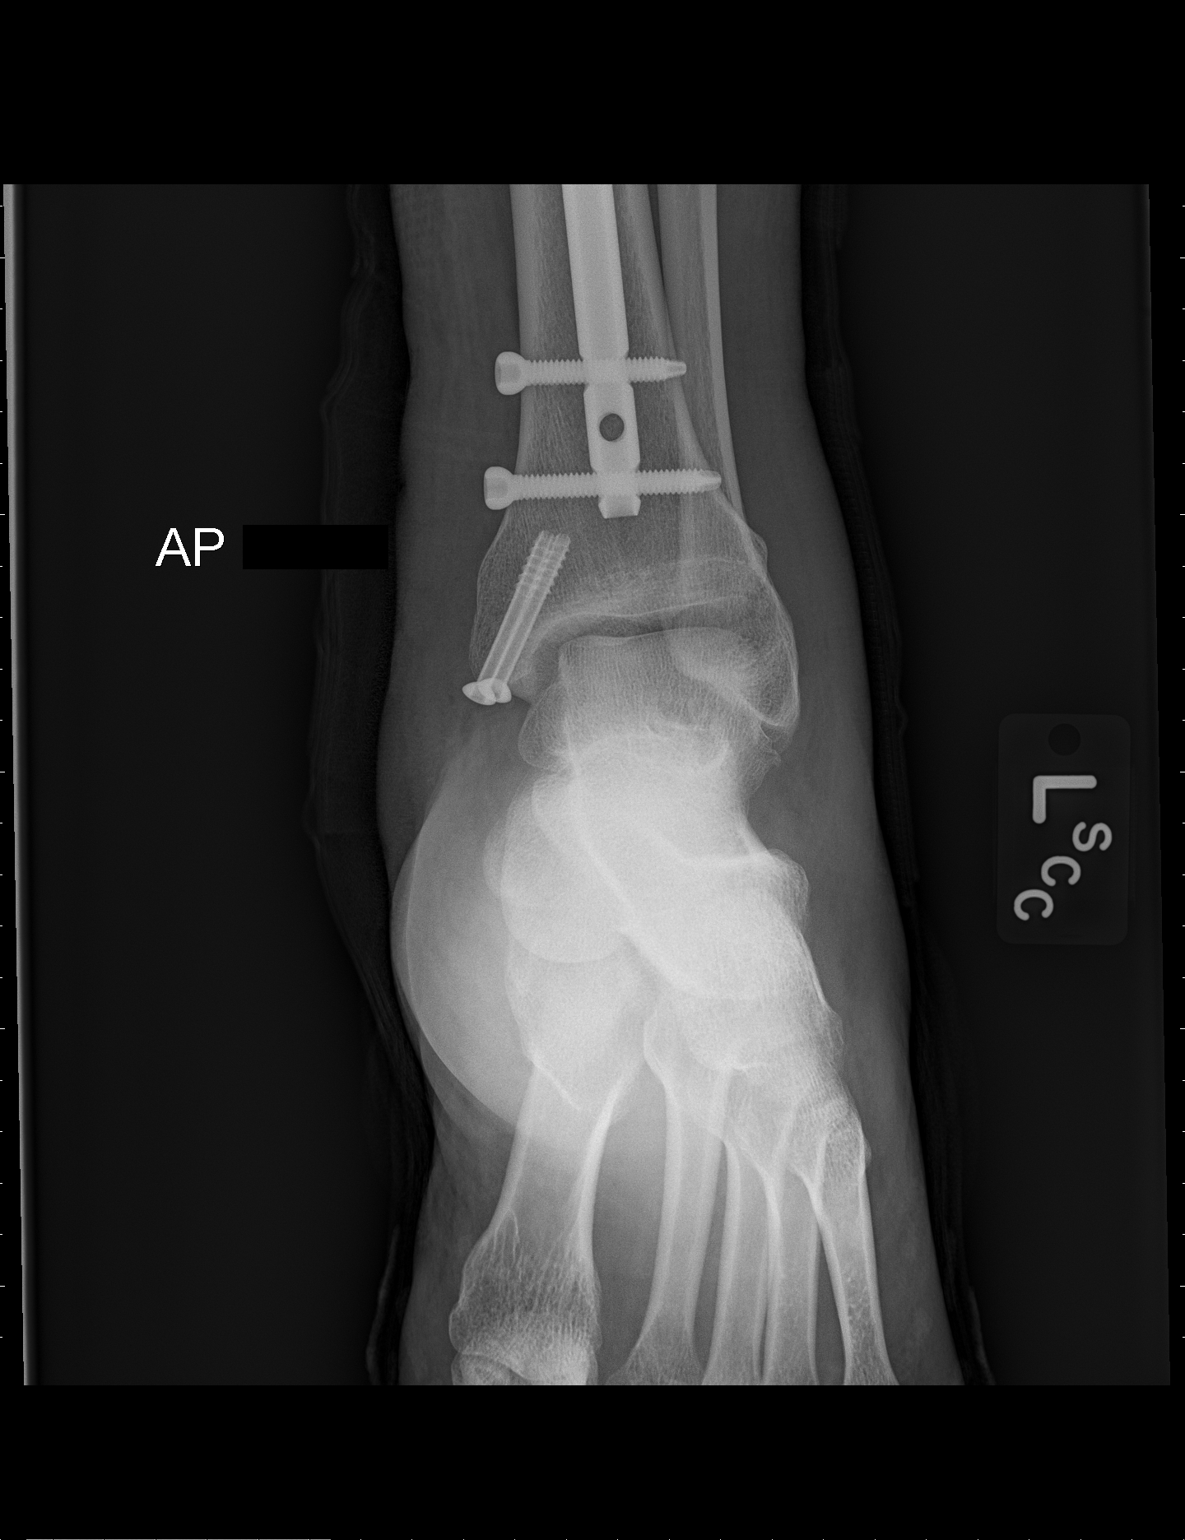

[ankle obl]
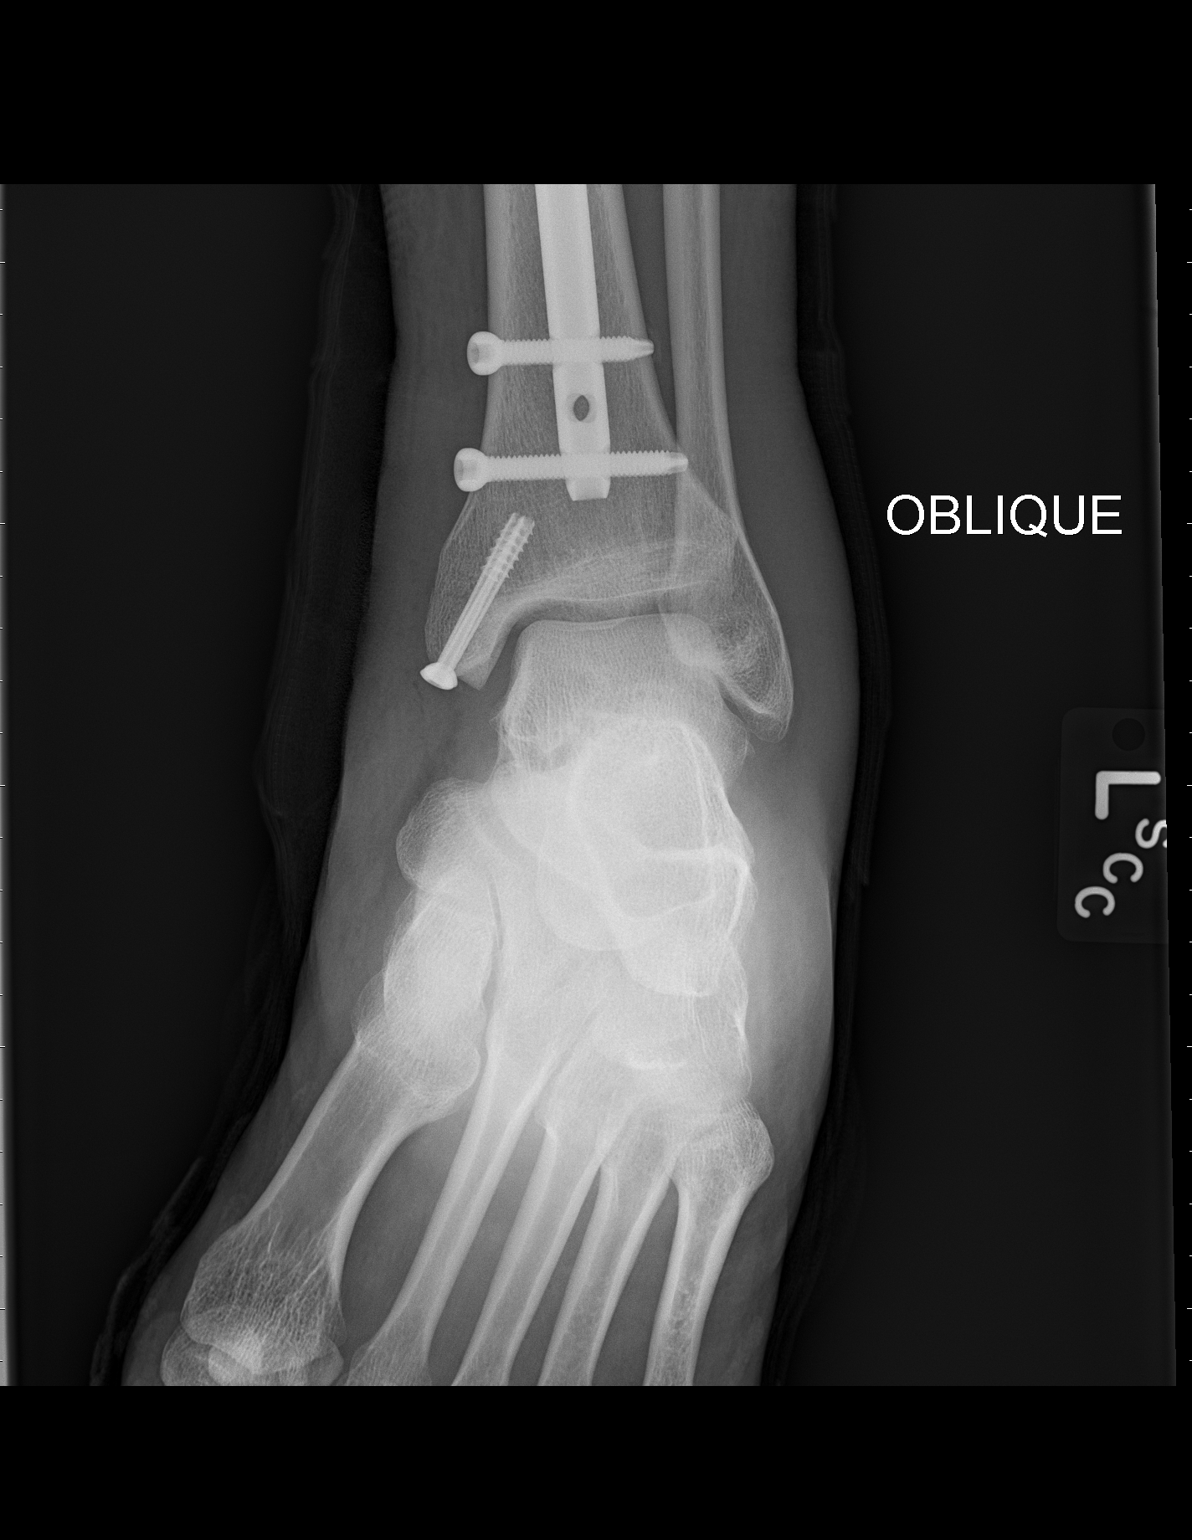

[ankle lat]
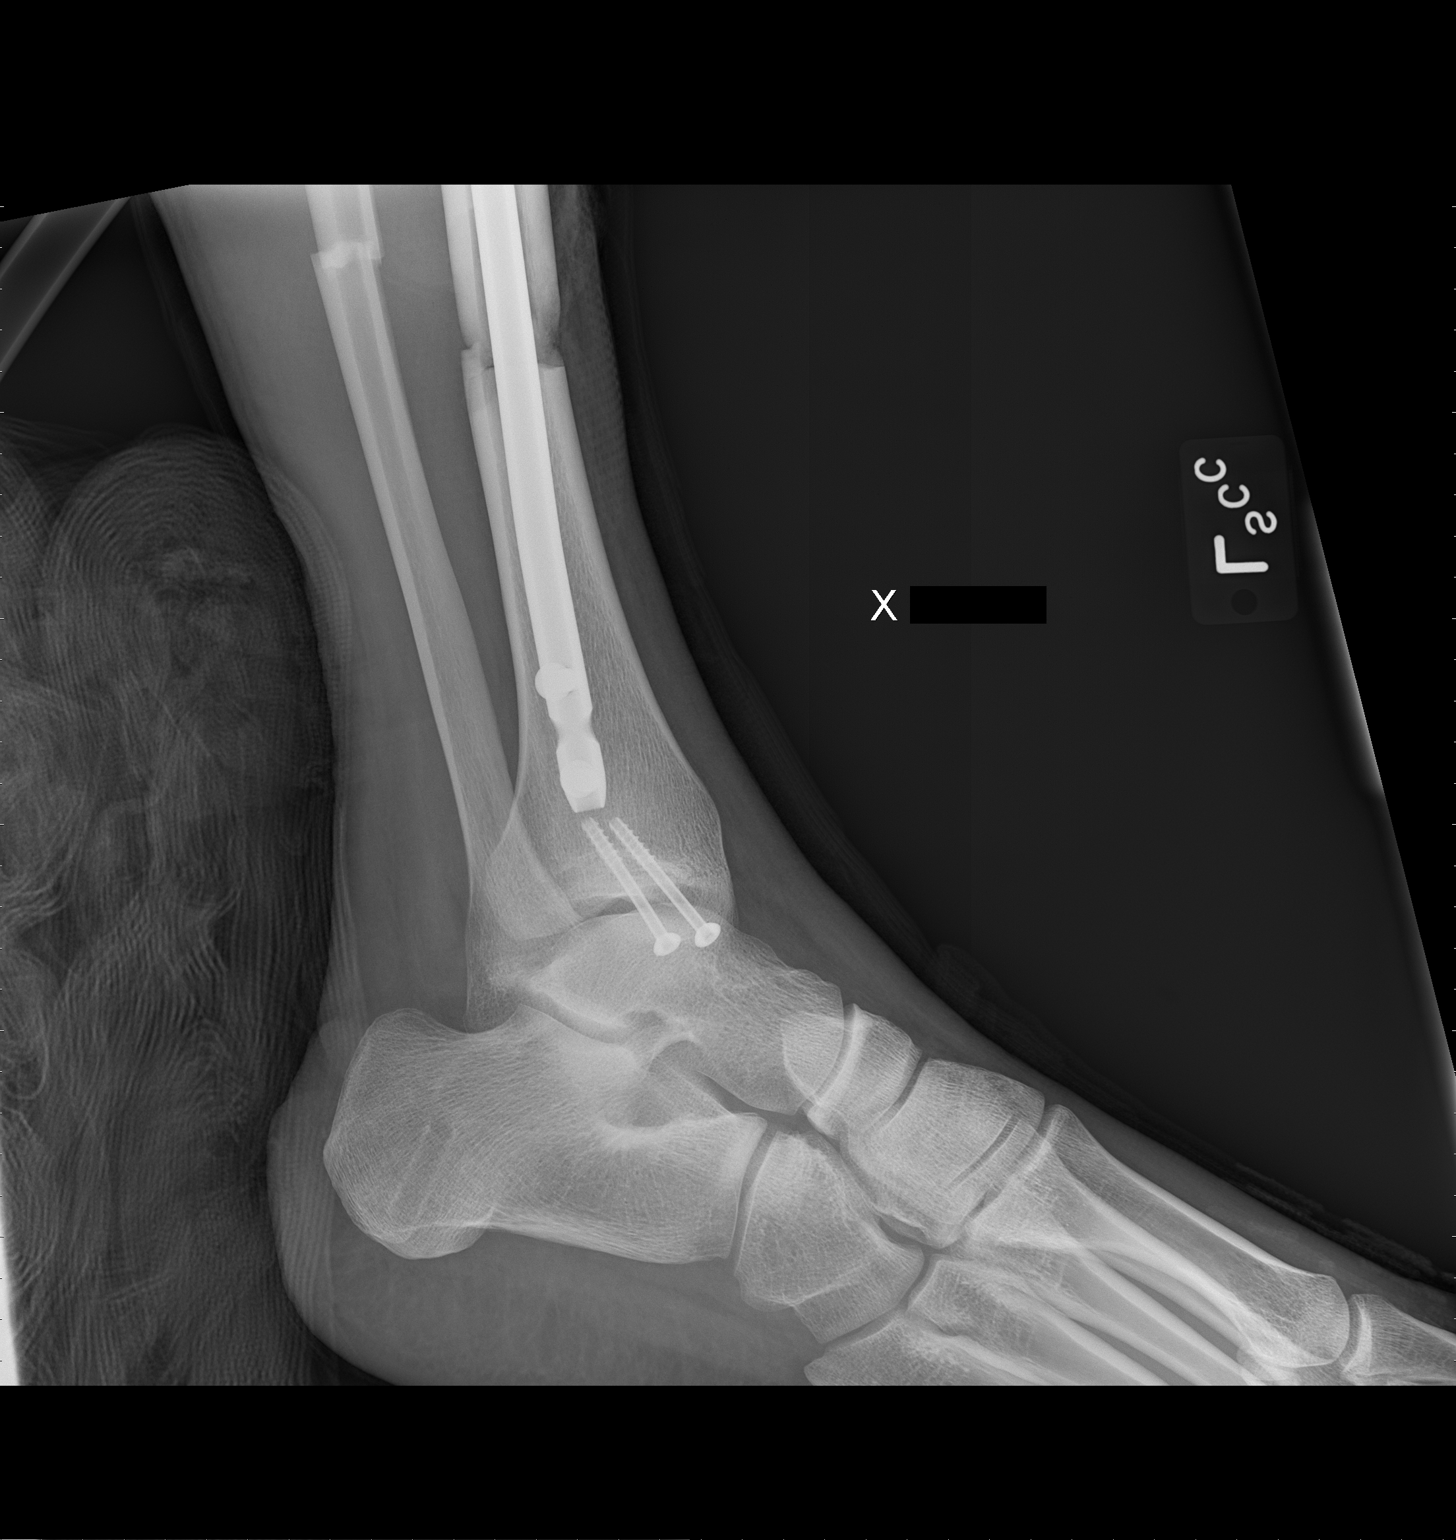

[3 of 3 positions shown; findings below may reference images not displayed]

FINDINGS: An intramedullary rod is seen transfixing a distal tibia
fracture.  To distal interlocking screws are in place.  Two screws
are seen transfixing the medial malleolus fracture.  Anatomic
alignment.  No breakage or loosening of the hardware.  Fibula
fracture is noted.
IMPRESSION: ORIF diaphyseal tibia fracture.

ORIF medial malleolus fracture.  Anatomic alignment.

## 2014-08-09 ENCOUNTER — Encounter (HOSPITAL_COMMUNITY): Payer: Self-pay | Admitting: Neurology

## 2014-08-09 ENCOUNTER — Emergency Department (HOSPITAL_COMMUNITY)
Admission: EM | Admit: 2014-08-09 | Discharge: 2014-08-09 | Disposition: A | Discharge status: Home / Self Care | Payer: Self-pay | Attending: Emergency Medicine | Admitting: Emergency Medicine

## 2014-08-09 ENCOUNTER — Emergency Department (HOSPITAL_COMMUNITY): Payer: Self-pay

## 2014-08-09 DIAGNOSIS — K644 Residual hemorrhoidal skin tags: Secondary | ICD-10-CM | POA: Insufficient documentation

## 2014-08-09 DIAGNOSIS — R195 Other fecal abnormalities: Secondary | ICD-10-CM | POA: Insufficient documentation

## 2014-08-09 DIAGNOSIS — Z79899 Other long term (current) drug therapy: Secondary | ICD-10-CM | POA: Insufficient documentation

## 2014-08-09 DIAGNOSIS — R198 Other specified symptoms and signs involving the digestive system and abdomen: Secondary | ICD-10-CM

## 2014-08-09 DIAGNOSIS — R194 Change in bowel habit: Secondary | ICD-10-CM | POA: Insufficient documentation

## 2014-08-09 LAB — CBC WITH DIFFERENTIAL/PLATELET
BASOS ABS: 0 10*3/uL (ref 0.0–0.1)
Basophils Relative: 0 % (ref 0–1)
Eosinophils Absolute: 0.4 10*3/uL (ref 0.0–0.7)
Eosinophils Relative: 5 % (ref 0–5)
HCT: 37.1 % — ABNORMAL LOW (ref 39.0–52.0)
HEMOGLOBIN: 12.6 g/dL — AB (ref 13.0–17.0)
LYMPHS ABS: 1.1 10*3/uL (ref 0.7–4.0)
LYMPHS PCT: 15 % (ref 12–46)
MCH: 30.8 pg (ref 26.0–34.0)
MCHC: 34 g/dL (ref 30.0–36.0)
MCV: 90.7 fL (ref 78.0–100.0)
MONO ABS: 0.3 10*3/uL (ref 0.1–1.0)
Monocytes Relative: 5 % (ref 3–12)
NEUTROS ABS: 5.5 10*3/uL (ref 1.7–7.7)
Neutrophils Relative %: 75 % (ref 43–77)
PLATELETS: 161 10*3/uL (ref 150–400)
RBC: 4.09 MIL/uL — AB (ref 4.22–5.81)
RDW: 12.1 % (ref 11.5–15.5)
WBC: 7.3 10*3/uL (ref 4.0–10.5)

## 2014-08-09 LAB — COMPREHENSIVE METABOLIC PANEL
ALT: 54 U/L — AB (ref 0–53)
ANION GAP: 3 — AB (ref 5–15)
AST: 40 U/L — ABNORMAL HIGH (ref 0–37)
Albumin: 3.9 g/dL (ref 3.5–5.2)
Alkaline Phosphatase: 63 U/L (ref 39–117)
BUN: 14 mg/dL (ref 6–23)
CO2: 32 mmol/L (ref 19–32)
CREATININE: 1.52 mg/dL — AB (ref 0.50–1.35)
Calcium: 9.6 mg/dL (ref 8.4–10.5)
Chloride: 101 mmol/L (ref 96–112)
GFR, EST AFRICAN AMERICAN: 65 mL/min — AB (ref >90)
GFR, EST NON AFRICAN AMERICAN: 56 mL/min — AB (ref >90)
GLUCOSE: 99 mg/dL (ref 70–99)
Potassium: 4.1 mmol/L (ref 3.5–5.1)
SODIUM: 136 mmol/L (ref 135–145)
Total Bilirubin: 0.6 mg/dL (ref 0.3–1.2)
Total Protein: 7.2 g/dL (ref 6.0–8.3)

## 2014-08-09 LAB — POC OCCULT BLOOD, ED: FECAL OCCULT BLD: NEGATIVE

## 2014-08-09 LAB — LIPASE, BLOOD: Lipase: 41 U/L (ref 11–59)

## 2014-08-09 NOTE — Discharge Instructions (Signed)
Bloody Stools °Bloody stools often mean that there is a problem in the digestive tract. Your caregiver may use the term "melena" to describe black, tarry, and bad smelling stools or "hematochezia" to describe red or maroon-colored stools. Blood seen in the stool can be caused by bleeding anywhere along the intestinal tract.  °A black stool usually means that blood is coming from the upper part of the gastrointestinal tract (esophagus, stomach, or small bowel). Passing maroon-colored stools or bright red blood usually means that blood is coming from lower down in the large bowel or the rectum. However, sometimes massive bleeding in the stomach or small intestine can cause bright red bloody stools.  °Consuming black licorice, lead, iron pills, medicines containing bismuth subsalicylate, or blueberries can also cause black stools. Your caregiver can test black stools to see if blood is present. °It is important that the cause of the bleeding be found. Treatment can then be started, and the problem can be corrected. Rectal bleeding may not be serious, but you should not assume everything is okay until you know the cause. It is very important to follow up with your caregiver or a specialist in gastrointestinal problems. °CAUSES  °Blood in the stools can come from various underlying causes. Often, the cause is not found during your first visit. Testing is often needed to discover the cause of bleeding in the gastrointestinal tract. Causes range from simple to serious or even life-threatening. Possible causes include: °· Hemorrhoids. These are veins that are full of blood (engorged) in the rectum. They cause pain, inflammation, and may bleed. °· Anal fissures. These are areas of painful tearing which may bleed. They are often caused by passing hard stool. °· Diverticulosis. These are pouches that form on the colon over time, with age, and may bleed significantly. °· Diverticulitis. This is inflammation in areas with  diverticulosis. It can cause pain, fever, and bloody stools, although bleeding is rare. °· Proctitis and colitis. These are inflamed areas of the rectum or colon. They may cause pain, fever, and bloody stools. °· Polyps and cancer. Colon cancer is a leading cause of preventable cancer death. It often starts out as precancerous polyps that can be removed during a colonoscopy, preventing progression into cancer. Sometimes, polyps and cancer may cause rectal bleeding. °· Gastritis and ulcers. Bleeding from the upper gastrointestinal tract (near the stomach) may travel through the intestines and produce black, sometimes tarry, often bad smelling stools. In certain cases, if the bleeding is fast enough, the stools may not be black, but red and the condition may be life-threatening. °SYMPTOMS  °You may have stools that are bright red and bloody, that are normal color with blood on them, or that are dark black and tarry. In some cases, you may only have blood in the toilet bowl. Any of these cases need medical care. You may also have: °· Pain at the anus or anywhere in the rectum. °· Lightheadedness or feeling faint. °· Extreme weakness. °· Nausea or vomiting. °· Fever. °DIAGNOSIS °Your caregiver may use the following methods to find the cause of your bleeding: °· Taking a medical history. Age is important. Older people tend to develop polyps and cancer more often. If there is anal pain and a hard, large stool associated with bleeding, a tear of the anus may be the cause. If blood drips into the toilet after a bowel movement, bleeding hemorrhoids may be the problem. The color and frequency of the bleeding are additional considerations. In most cases, the medical history provides clues, but seldom the final   answer. °· A visual and finger (digital) exam. Your caregiver will inspect the anal area, looking for tears and hemorrhoids. A finger exam can provide information when there is tenderness or a growth inside. In men, the  prostate is also examined. °· Endoscopy. Several types of small, long scopes (endoscopes) are used to view the colon. °¨ In the office, your caregiver may use a rigid, or more commonly, a flexible viewing sigmoidoscope. This exam is called flexible sigmoidoscopy. It is performed in 5 to 10 minutes. °¨ A more thorough exam is accomplished with a colonoscope. It allows your caregiver to view the entire 5 to 6 foot long colon. Medicine to help you relax (sedative) is usually given for this exam. Frequently, a bleeding lesion may be present beyond the reach of the sigmoidoscope. So, a colonoscopy may be the best exam to start with. Both exams are usually done on an outpatient basis. This means the patient does not stay overnight in the hospital or surgery center. °¨ An upper endoscopy may be needed to examine your stomach. Sedation is used and a flexible endoscope is put in your mouth, down to your stomach. °· A barium enema X-ray. This is an X-ray exam. It uses liquid barium inserted by enema into the rectum. This test alone may not identify an actual bleeding point. X-rays highlight abnormal shadows, such as those made by lumps (tumors), diverticuli, or colitis. °TREATMENT  °Treatment depends on the cause of your bleeding.  °· For bleeding from the stomach or colon, the caregiver doing your endoscopy or colonoscopy may be able to stop the bleeding as part of the procedure. °· Inflammation or infection of the colon can be treated with medicines. °· Many rectal problems can be treated with creams, suppositories, or warm baths. °· Surgery is sometimes needed. °· Blood transfusions are sometimes needed if you have lost a lot of blood. °· For any bleeding problem, let your caregiver know if you take aspirin or other blood thinners regularly. °HOME CARE INSTRUCTIONS  °· Take any medicines exactly as prescribed. °· Keep your stools soft by eating a diet high in fiber. Prunes (1 to 3 a day) work well for many people. °· Drink  enough water and fluids to keep your urine clear or pale yellow. °· Take sitz baths if advised. A sitz bath is when you sit in a bathtub with warm water for 10 to 15 minutes to soak, soothe, and cleanse the rectal area. °· If enemas or suppositories are advised, be sure you know how to use them. Tell your caregiver if you have problems with this. °· Monitor your bowel movements to look for signs of improvement or worsening. °SEEK MEDICAL CARE IF:  °· You do not improve in the time expected. °· Your condition worsens after initial improvement. °· You develop any new symptoms. °SEEK IMMEDIATE MEDICAL CARE IF:  °· You develop severe or prolonged rectal bleeding. °· You vomit blood. °· You feel weak or faint. °· You have a fever. °MAKE SURE YOU: °· Understand these instructions. °· Will watch your condition. °· Will get help right away if you are not doing well or get worse. °Document Released: 06/23/2002 Document Revised: 09/25/2011 Document Reviewed: 11/18/2010 °ExitCare® Patient Information ©2015 [employer]. This information is not intended to replace advice given to you by your health care provider. Make sure you discuss any questions you have with your health care provider. ° °Emergency Department Resource Guide °1) Find a Doctor and Pay Out of   Pocket °Although you won't have to find out who is covered by your insurance plan, it is a good idea to ask around and get recommendations. You will then need to call the office and see if the doctor you have chosen will accept you as a new patient and what types of options they offer for patients who are self-pay. Some doctors offer discounts or will set up payment plans for their patients who do not have insurance, but you will need to ask so you aren't surprised when you get to your appointment. ° °2) Contact Your Local Health Department °Not all health departments have doctors that can see patients for sick visits, but many do, so it is worth a call to see if yours  does. If you don't know where your local health department is, you can check in your phone book. The CDC also has a tool to help you locate your state's health department, and many state websites also have listings of all of their local health departments. ° °3) Find a Walk-in Clinic °If your illness is not likely to be very severe or complicated, you may want to try a walk in clinic. These are popping up all over the country in pharmacies, drugstores, and shopping centers. They're usually staffed by nurse practitioners or physician assistants that have been trained to treat common illnesses and complaints. They're usually fairly quick and inexpensive. However, if you have serious medical issues or chronic medical problems, these are probably not your best option. ° °No Primary Care Doctor: °- Call [facility] at  832-8000 - they can help you locate a primary care doctor that  accepts your insurance, provides certain services, etc. °- Physician Referral Service- 1-800-533-3463 ° °Chronic Pain Problems: °Organization         Address  Phone   Notes  °[facility]  (336) 297-2271 Patients need to be referred by their primary care doctor.  ° °Medication Assistance: °Organization         Address  Phone   Notes  °[place] [facility] [address] °[place], [place] [address] (336) 641-8030 --Must be a resident of [place] °-- Must have NO insurance coverage whatsoever (no Medicaid/ Medicare, etc.) °-- The pt. MUST have a primary care doctor that directs their care regularly and follows them in the community °  °[facility]  (866) 331-1348   °[employer]  (888) 892-1162   ° °Agencies that provide inexpensive medical care: °Organization         Address  Phone   Notes  °[facility]  (336) 832-8035   ° Internal Medicine    (336) 832-7272   °[facility] [address] °[place], [place] [address] (336) 832-4777    °[facility] [address], °[place] (336) 271-4999   °[facility]    (336) 373-0678   °[facility]    (336) 272-1050   °[facility] ° [address], [place] Phone:  (336) 832-4444, Fax:  (336) 832-4440 Hours of Operation:  9 am - 6 pm, M-F.  Also accepts Medicaid/Medicare and self-pay.  °[facility] ° [address], [place] Phone: (336) [phone], Fax: (336) [phone]. Hours of Operation:  8:30 am - 5:30 pm, M-F.  Also accepts Medicaid and self-pay.  °[facility] [place] [address], [place] Phone: (336) 878-6027   °[facility] [address], [place], [place] (336)723-1848, Ext. 123 Mondays & Thursdays: 7-9 AM.  First 15 patients are seen on a first come, first serve basis. °  ° °Medicaid-accepting [place] Providers: ° °Organization         Address  Phone   Notes  °[facility] [address], [place] (336) 641-2100 Also accepts self-pay patients.  °[facility] [address], [place] ° (336) 856-9996   °[facility] [address], [place] (336) 288-8857   °[facility] [address] [place] [address], [place] (336) 299-7000   °Veita [facility] [address], [place]  ° (336) 373-1557 Only accepts [place] Access Medicaid patients after they have their name applied to their card.  ° °Self-Pay (no insurance) in [place]: ° °Organization         Address  Phone   Notes  °Sickle Cell Patients, [facility] [address], [place] (336) 832-1970   °[facility] [address], [place] (336) 832-4400   °[facility] ° [address], [place] (336) 992-4800   °[facility]/Dr. Osei-Bonsu ° [address] [place] [address], [place] or [address], [place] (336)  [phone] Phone number for both [place] and [place] locations is the same.  °[facility] [address], [place] (336) 299-0000   °[facility] [address] [place] [address], [place] or [address] (336) 852-7530 °(336) 878-2260   °[facility] [address], [place] (336) 350-1642, phone; (336) 294-5005, fax Sees patients 1st and 3rd Saturday of every month.  Must not qualify for public or private insurance (i.e. Medicaid, Medicare, [place] Health Choice, Veterans' Benefits) • Household income should be no more than 200% of the poverty level •The clinic cannot treat you if you are pregnant or think you are pregnant • Sexually transmitted diseases are not treated at the clinic.  ° ° °Dental Care: °Organization         Address  Phone  Notes  °[facility] [facility] [address],  (336) 641-6152 Accepts children up to age 21 who are enrolled in Medicaid or [place] Health Choice; pregnant women with a Medicaid card; and children who have applied for Medicaid or [place] Health Choice, but were declined, whose parents can pay a reduced fee at time of service.  °[facility] [place]  [address], [place] (336) 641-7733 Accepts children up to age 21 who are enrolled in Medicaid or [place] Health Choice; pregnant women with a Medicaid card; and children who have applied for Medicaid or [place] Health Choice, but were declined, whose parents can pay a reduced fee at time of service.  °[facility] ° [address],  (336) 641-4533 Patients are seen by appointment only. Walk-ins are not accepted. [facility] will see patients 18 years of age and older. °Monday - Tuesday (8am-5pm) °Most Wednesdays (8:30-5pm) °$30 per visit, cash only  °[facility] ° [address], [place] (336) 641-4533 Patients are seen by  appointment only. Walk-ins are not accepted. [facility] will see patients 18 years of age and older. °One Wednesday Evening (Monthly: Volunteer Based).  $30 per visit, cash only  °  [facility]  (919) 537-3737 for adults; Children under age 4, call Graduate Pediatric Dentistry at ([phone]) [phone]. Children aged 4-14, please call ([phone]) [phone] to request a pediatric application. ° Dental services are provided in all areas of dental care including fillings, crowns and bridges, complete and partial dentures, implants, gum treatment, root canals, and extractions. Preventive care is also provided. Treatment is provided to both adults and children. °Patients are selected via a lottery and there is often a waiting list. °  °[facility] [address], °[place] ° (336) 763-8833 [email or web address] °  °[facility] [address], [place], [place] (336)723-1848, Ext. 123 Second and Fourth Thursday of each month, opens at 6:30 AM; Clinic ends at 9 AM.  Patients are seen on a first-come first-served basis, and a limited number are seen during each clinic.  ° °[facility] ° [address], [place], [place] (336) 723-7904   Eligibility Requirements °You must have lived in [place], [place], or [place] counties for at least the last three months. °  You cannot be eligible for state or federal sponsored healthcare insurance, including Veterans Administration, Medicaid, or Medicare. °  You generally cannot be eligible for healthcare insurance through your employer.  °  How to apply: °Eligibility screenings are held every Tuesday and Wednesday afternoon from 1:00 pm until 4:00 pm. You do not need an appointment for the interview!  °[facility] [address], [place], [place] 336-631-2330   °[facility]  336-342-8273   °[facility]  336-703-3100   °[facility]  336-570-6415    ° °Behavioral Health Resources in the Community: °Intensive Outpatient Programs °Organization         Address  Phone  Notes  °[facility] [address], [place], [place] 336-878-6098   °[facility] [address], [place], [place] 336-832-9800   °[facility] [address], [place], [place] ° 336-882-2125   °[facility] [address],  °[place], [place] 1-800-853-5163 or 336-641-4981   °Substance Abuse Resources °Organization         Address  Phone  Notes  °[facility]  336-882-2125   °[facility]  336-784-9470   °[facility]  336-285-9073   °[facility]  336-845-3988   °Residential & Outpatient Substance Abuse Program  1-800-659-3381   °Psychological Services °Organization         Address  Phone  Notes  °[facility]  336- 832-9600   °[facility]  336- 378-7881   °[facility] [address], [place] 1-800-853-5163 or 336-641-4981   ° °Mobile Crisis Teams °Organization         Address  Phone  Notes  °[facility], Mobile Crisis Care Unit  1-877-626-1772   °Assertive °[facility] ° [address] [place], [place] 336-834-9664   °Sharon DeEsch [address] °[place] 336-554-5454   ° °Self-Help/Support Groups °Organization         Address  Phone             Notes  °[facility] [place] - variety of support groups  336- 373-1402 Call for more information  °Narcotics Anonymous (NA), [facility] [address], °[place]  2 meetings at this location  ° °Residential Treatment Programs °Organization           Address  Phone  Notes  °[facility] [address],    °[place]  1-866-801-8205   °[facility] ° [address], [place], [place] 704-293-8524   °[facility] [address], [place] 336-845-3988 Admissions: 8am-3pm M-F  °[facility] [address]    °[place], [place] 336-841-1104   °[facility] [address], [place], [place] 336-379-7146   °[facility] [address]  °[place], [place] 336-285-9073   °[facility] Intensive Outpatient [address], [place], [place] 336-852-3033   °[facility] [address]  °[place], [place] 1-877-615-2722 or 336-784-9470   °[facility] [address] [place], [place] 336-227-7417 Accepts Medicaid  °[facility] [address]  °[place] 1-800-659-3381 Substance Abuse/Addiction Treatment  ° °[place] Behavioral Health Resources °Organization         Address  Phone  Notes  °[facility]  (888) 581-9988   °Julie Brannon, PhD [address] [place], [place]   (336) 349-5553 or (336) 951-0000   °[facility]   [address] °[place], [place] (336) 349-4454   °[facility] [address], [place], [place] (336) 342-8316 Insurance/Medicaid/sponsorship through [facility]  °[facility] [address]                                    [place], [place] (336) 342-8316 Therapy/tele-psych/case  °[facility] [address]  ° [place], [place] (336) 349-2233    °Dr. Arfeen  (336) 349-4544   °[facility] [place]  [employer] [place] [facility] 1) [address], [place] °2) [address], [place] °3)  [address] [place] [address], [place] (336) 349-3220 °(336) 342-7768 ° °(336) 342-8140   °[place] [facility] (336) 342-1394 or (336) 342-3537 (After Hours)    ° ° ° °

## 2014-08-09 NOTE — ED Notes (Signed)
Pt reports bloating, intermittent rectal bleeding, and mucus in stool since 2011. Reports s/s have been getting worse recently. Thinks it may be from all the antibiotics he was given in 2011. Pt is a x 4

## 2014-08-09 NOTE — ED Notes (Signed)
Patient transported to X-ray 

## 2014-08-09 NOTE — ED Provider Notes (Signed)
CSN: [id number]     Arrival date & time 08/09/14  0940 History   First MD Initiated Contact with Patient 08/09/14 [phone]     Chief Complaint  Patient presents with  . Bloated  . Rectal Bleeding     (Consider location/radiation/quality/duration/timing/severity/associated sxs/prior Treatment) HPI The patient reports he's had problems with bloating and intermittent rectal bleeding for approximate 4 years now. He reports if he eats dairy products like cream cheese or ice cream he will get very bloated. He also describes variable episodes of constipation and slow moving stool interspersed with occasions of diarrhea. He does not have any significant abdominal pain. He reports he came in today because he noticed his stool to be more mucousy in appearance and he found that concerning. He thinks all of this dysfunction of his bowels started after he had a extremity injury 4 years ago and had to be on prolonged courses of antibiotics. He however has not been on antibiotics for over 2 years. He does not have fever or GU symptoms. Some of his concerns and coming to the emergency department today center around some reading he had done on the Internet about proctitis. History reviewed. No pertinent past medical history. Past Surgical History  Procedure Laterality Date  . Leg surgery     No family history on file. History  Substance Use Topics  . Smoking status: Never Smoker   . Smokeless tobacco: Not on file  . Alcohol Use: No    Review of Systems 10 Systems reviewed and are negative for acute change except as noted in the HPI.    Allergies  Review of patient's allergies indicates no known allergies.  Home Medications   Prior to Admission medications   Medication Sig Start Date End Date Taking? Authorizing Provider  Omega-3 Fatty Acids (FISH OIL PO) Take 1 capsule by mouth daily.   Yes Historical Provider, MD  OVER THE COUNTER MEDICATION Take 1 Dose by mouth daily. Nugenix testosterone  supplement   Yes Historical Provider, MD   BP 129/70 mmHg  Pulse 67  Temp(Src) 97.6 F (36.4 C) (Oral)  Resp 16  Ht  (1.905 m)  Wt 265 lb (120.203 kg)  BMI 33.12 kg/m2  SpO2 98% Physical Exam  Constitutional: He is oriented to person, place, and time. He appears well-developed and well-nourished.  HENT:  Head: Normocephalic and atraumatic.  Eyes: EOM are normal. Pupils are equal, round, and reactive to light.  Neck: Neck supple.  Cardiovascular: Normal rate, regular rhythm, normal heart sounds and intact distal pulses.   Pulmonary/Chest: Effort normal and breath sounds normal.  Abdominal: Soft. Bowel sounds are normal. He exhibits no distension. There is no tenderness.  Genitourinary:  External examination of the anus appears normal. There is a very small nonthrombosed hemorrhoid present. The digital examination is nontender without mass or prostate tenderness. The vault is empty and there is no mucus or blood present at this time.  Musculoskeletal: Normal range of motion. He exhibits no edema.  Neurological: He is alert and oriented to person, place, and time. He has normal strength. Coordination normal. GCS eye subscore is 4. GCS verbal subscore is 5. GCS motor subscore is 6.  Skin: Skin is warm, dry and intact.  Psychiatric: He has a normal mood and affect.    ED Course  Procedures (including critical care time) Labs Review Labs Reviewed  CBC WITH DIFFERENTIAL/PLATELET - Abnormal; Notable for the following:    RBC 4.09 (*)    Hemoglobin 12.6 (*)  HCT 37.1 (*)    All other components within normal limits  COMPREHENSIVE METABOLIC PANEL - Abnormal; Notable for the following:    Creatinine, Ser 1.52 (*)    AST 40 (*)    ALT 54 (*)    GFR calc non Af Amer 56 (*)    GFR calc Af Amer 65 (*)    Anion gap 3 (*)    All other components within normal limits  LIPASE, BLOOD  POC OCCULT BLOOD, ED    Imaging Review Dg Abd Acute W/chest  08/09/2014   CLINICAL DATA:   Abdominal distention and rectal bleeding  EXAM: ACUTE ABDOMEN SERIES (ABDOMEN 2 VIEW & CHEST 1 VIEW)  COMPARISON:  10/14/2010  FINDINGS: Cardiac shadow is within normal limits. The lungs are well aerated bilaterally. No focal infiltrate is seen.  Scattered large and small bowel gas is noted without obstructive change. Mild retained fecal material is seen. No free air is noted. No abnormal mass or abnormal calcifications are seen. The bony structures are within normal limits.  IMPRESSION: No acute abnormality noted.   Electronically Signed   By: Alcide CleverMark  Lukens M.D.   On: 08/09/2014 11:47     EKG Interpretation None      MDM   Final diagnoses:  Mucus in stool  Change in bowel movement       Arby BarretteMarcy Geraldin Habermehl, MD 08/09/14 1207
